# Patient Record
Sex: Female | Born: 1945 | Race: White | Hispanic: No | Smoking: Former smoker
Health system: Southern US, Community
[De-identification: ages and names within clinical notes are randomized; demographics above are authoritative.]

## PROBLEM LIST (undated history)

## (undated) DIAGNOSIS — M1990 Unspecified osteoarthritis, unspecified site: Secondary | ICD-10-CM

## (undated) DIAGNOSIS — D649 Anemia, unspecified: Secondary | ICD-10-CM

## (undated) DIAGNOSIS — J189 Pneumonia, unspecified organism: Secondary | ICD-10-CM

## (undated) DIAGNOSIS — E039 Hypothyroidism, unspecified: Secondary | ICD-10-CM

## (undated) DIAGNOSIS — M797 Fibromyalgia: Secondary | ICD-10-CM

## (undated) DIAGNOSIS — K219 Gastro-esophageal reflux disease without esophagitis: Secondary | ICD-10-CM

## (undated) DIAGNOSIS — M810 Age-related osteoporosis without current pathological fracture: Secondary | ICD-10-CM

## (undated) DIAGNOSIS — I10 Essential (primary) hypertension: Secondary | ICD-10-CM

## (undated) HISTORY — PX: FRACTURE SURGERY: SHX138

## (undated) HISTORY — PX: TONSILLECTOMY: SUR1361

## (undated) HISTORY — PX: BACK SURGERY: SHX140

## (undated) HISTORY — PX: EYE SURGERY: SHX253

---

## 1973-11-19 HISTORY — PX: ABDOMINAL HYSTERECTOMY: SHX81

## 2013-12-14 DIAGNOSIS — S335XXA Sprain of ligaments of lumbar spine, initial encounter: Secondary | ICD-10-CM | POA: Diagnosis not present

## 2013-12-17 DIAGNOSIS — R262 Difficulty in walking, not elsewhere classified: Secondary | ICD-10-CM | POA: Diagnosis not present

## 2013-12-17 DIAGNOSIS — M545 Low back pain, unspecified: Secondary | ICD-10-CM | POA: Diagnosis not present

## 2013-12-17 DIAGNOSIS — S335XXA Sprain of ligaments of lumbar spine, initial encounter: Secondary | ICD-10-CM | POA: Diagnosis not present

## 2013-12-24 DIAGNOSIS — R262 Difficulty in walking, not elsewhere classified: Secondary | ICD-10-CM | POA: Diagnosis not present

## 2013-12-24 DIAGNOSIS — M545 Low back pain, unspecified: Secondary | ICD-10-CM | POA: Diagnosis not present

## 2013-12-24 DIAGNOSIS — S335XXA Sprain of ligaments of lumbar spine, initial encounter: Secondary | ICD-10-CM | POA: Diagnosis not present

## 2013-12-25 DIAGNOSIS — M545 Low back pain, unspecified: Secondary | ICD-10-CM | POA: Diagnosis not present

## 2013-12-25 DIAGNOSIS — R262 Difficulty in walking, not elsewhere classified: Secondary | ICD-10-CM | POA: Diagnosis not present

## 2013-12-25 DIAGNOSIS — S335XXA Sprain of ligaments of lumbar spine, initial encounter: Secondary | ICD-10-CM | POA: Diagnosis not present

## 2013-12-28 DIAGNOSIS — M545 Low back pain, unspecified: Secondary | ICD-10-CM | POA: Diagnosis not present

## 2013-12-28 DIAGNOSIS — S335XXA Sprain of ligaments of lumbar spine, initial encounter: Secondary | ICD-10-CM | POA: Diagnosis not present

## 2013-12-28 DIAGNOSIS — R262 Difficulty in walking, not elsewhere classified: Secondary | ICD-10-CM | POA: Diagnosis not present

## 2013-12-31 DIAGNOSIS — M545 Low back pain, unspecified: Secondary | ICD-10-CM | POA: Diagnosis not present

## 2013-12-31 DIAGNOSIS — S335XXA Sprain of ligaments of lumbar spine, initial encounter: Secondary | ICD-10-CM | POA: Diagnosis not present

## 2013-12-31 DIAGNOSIS — R262 Difficulty in walking, not elsewhere classified: Secondary | ICD-10-CM | POA: Diagnosis not present

## 2014-01-01 DIAGNOSIS — R262 Difficulty in walking, not elsewhere classified: Secondary | ICD-10-CM | POA: Diagnosis not present

## 2014-01-01 DIAGNOSIS — M545 Low back pain, unspecified: Secondary | ICD-10-CM | POA: Diagnosis not present

## 2014-01-01 DIAGNOSIS — S335XXA Sprain of ligaments of lumbar spine, initial encounter: Secondary | ICD-10-CM | POA: Diagnosis not present

## 2014-01-04 DIAGNOSIS — M545 Low back pain, unspecified: Secondary | ICD-10-CM | POA: Diagnosis not present

## 2014-01-04 DIAGNOSIS — S335XXA Sprain of ligaments of lumbar spine, initial encounter: Secondary | ICD-10-CM | POA: Diagnosis not present

## 2014-01-04 DIAGNOSIS — R262 Difficulty in walking, not elsewhere classified: Secondary | ICD-10-CM | POA: Diagnosis not present

## 2014-01-15 DIAGNOSIS — M545 Low back pain, unspecified: Secondary | ICD-10-CM | POA: Diagnosis not present

## 2014-01-15 DIAGNOSIS — S335XXA Sprain of ligaments of lumbar spine, initial encounter: Secondary | ICD-10-CM | POA: Diagnosis not present

## 2014-01-15 DIAGNOSIS — R262 Difficulty in walking, not elsewhere classified: Secondary | ICD-10-CM | POA: Diagnosis not present

## 2014-01-21 DIAGNOSIS — M545 Low back pain, unspecified: Secondary | ICD-10-CM | POA: Diagnosis not present

## 2014-01-21 DIAGNOSIS — S335XXA Sprain of ligaments of lumbar spine, initial encounter: Secondary | ICD-10-CM | POA: Diagnosis not present

## 2014-01-21 DIAGNOSIS — R262 Difficulty in walking, not elsewhere classified: Secondary | ICD-10-CM | POA: Diagnosis not present

## 2014-01-22 DIAGNOSIS — R262 Difficulty in walking, not elsewhere classified: Secondary | ICD-10-CM | POA: Diagnosis not present

## 2014-01-22 DIAGNOSIS — M545 Low back pain, unspecified: Secondary | ICD-10-CM | POA: Diagnosis not present

## 2014-01-22 DIAGNOSIS — S335XXA Sprain of ligaments of lumbar spine, initial encounter: Secondary | ICD-10-CM | POA: Diagnosis not present

## 2014-01-25 DIAGNOSIS — S335XXA Sprain of ligaments of lumbar spine, initial encounter: Secondary | ICD-10-CM | POA: Diagnosis not present

## 2014-01-25 DIAGNOSIS — M48061 Spinal stenosis, lumbar region without neurogenic claudication: Secondary | ICD-10-CM | POA: Diagnosis not present

## 2014-01-28 DIAGNOSIS — S335XXA Sprain of ligaments of lumbar spine, initial encounter: Secondary | ICD-10-CM | POA: Diagnosis not present

## 2014-01-28 DIAGNOSIS — R262 Difficulty in walking, not elsewhere classified: Secondary | ICD-10-CM | POA: Diagnosis not present

## 2014-01-28 DIAGNOSIS — M545 Low back pain, unspecified: Secondary | ICD-10-CM | POA: Diagnosis not present

## 2014-01-29 DIAGNOSIS — M545 Low back pain, unspecified: Secondary | ICD-10-CM | POA: Diagnosis not present

## 2014-01-29 DIAGNOSIS — R262 Difficulty in walking, not elsewhere classified: Secondary | ICD-10-CM | POA: Diagnosis not present

## 2014-01-29 DIAGNOSIS — S335XXA Sprain of ligaments of lumbar spine, initial encounter: Secondary | ICD-10-CM | POA: Diagnosis not present

## 2014-02-02 DIAGNOSIS — S335XXA Sprain of ligaments of lumbar spine, initial encounter: Secondary | ICD-10-CM | POA: Diagnosis not present

## 2014-02-02 DIAGNOSIS — M545 Low back pain, unspecified: Secondary | ICD-10-CM | POA: Diagnosis not present

## 2014-02-02 DIAGNOSIS — R262 Difficulty in walking, not elsewhere classified: Secondary | ICD-10-CM | POA: Diagnosis not present

## 2014-05-10 DIAGNOSIS — I10 Essential (primary) hypertension: Secondary | ICD-10-CM | POA: Diagnosis not present

## 2014-05-10 DIAGNOSIS — E039 Hypothyroidism, unspecified: Secondary | ICD-10-CM | POA: Diagnosis not present

## 2014-05-10 DIAGNOSIS — E785 Hyperlipidemia, unspecified: Secondary | ICD-10-CM | POA: Diagnosis not present

## 2014-05-10 DIAGNOSIS — K219 Gastro-esophageal reflux disease without esophagitis: Secondary | ICD-10-CM | POA: Diagnosis not present

## 2014-05-17 DIAGNOSIS — N200 Calculus of kidney: Secondary | ICD-10-CM | POA: Diagnosis not present

## 2014-05-17 DIAGNOSIS — R3915 Urgency of urination: Secondary | ICD-10-CM | POA: Diagnosis not present

## 2014-05-17 DIAGNOSIS — R109 Unspecified abdominal pain: Secondary | ICD-10-CM | POA: Diagnosis not present

## 2014-05-31 DIAGNOSIS — R109 Unspecified abdominal pain: Secondary | ICD-10-CM | POA: Diagnosis not present

## 2014-05-31 DIAGNOSIS — R3915 Urgency of urination: Secondary | ICD-10-CM | POA: Diagnosis not present

## 2014-05-31 DIAGNOSIS — N200 Calculus of kidney: Secondary | ICD-10-CM | POA: Diagnosis not present

## 2014-06-03 DIAGNOSIS — N200 Calculus of kidney: Secondary | ICD-10-CM | POA: Diagnosis not present

## 2014-06-11 DIAGNOSIS — N200 Calculus of kidney: Secondary | ICD-10-CM | POA: Diagnosis not present

## 2014-06-11 DIAGNOSIS — IMO0002 Reserved for concepts with insufficient information to code with codable children: Secondary | ICD-10-CM | POA: Diagnosis not present

## 2014-06-11 DIAGNOSIS — R3915 Urgency of urination: Secondary | ICD-10-CM | POA: Diagnosis not present

## 2014-06-11 DIAGNOSIS — R109 Unspecified abdominal pain: Secondary | ICD-10-CM | POA: Diagnosis not present

## 2014-06-16 DIAGNOSIS — R109 Unspecified abdominal pain: Secondary | ICD-10-CM | POA: Diagnosis not present

## 2014-06-16 DIAGNOSIS — N200 Calculus of kidney: Secondary | ICD-10-CM | POA: Diagnosis not present

## 2014-06-16 DIAGNOSIS — I10 Essential (primary) hypertension: Secondary | ICD-10-CM | POA: Diagnosis not present

## 2014-06-16 DIAGNOSIS — E0789 Other specified disorders of thyroid: Secondary | ICD-10-CM | POA: Diagnosis not present

## 2014-06-16 DIAGNOSIS — Z79899 Other long term (current) drug therapy: Secondary | ICD-10-CM | POA: Diagnosis not present

## 2014-06-16 DIAGNOSIS — IMO0002 Reserved for concepts with insufficient information to code with codable children: Secondary | ICD-10-CM | POA: Diagnosis not present

## 2014-06-16 DIAGNOSIS — Z888 Allergy status to other drugs, medicaments and biological substances status: Secondary | ICD-10-CM | POA: Diagnosis not present

## 2014-06-16 DIAGNOSIS — Z87442 Personal history of urinary calculi: Secondary | ICD-10-CM | POA: Diagnosis not present

## 2014-06-16 DIAGNOSIS — Z880 Allergy status to penicillin: Secondary | ICD-10-CM | POA: Diagnosis not present

## 2014-06-16 DIAGNOSIS — Z96649 Presence of unspecified artificial hip joint: Secondary | ICD-10-CM | POA: Diagnosis not present

## 2014-06-16 DIAGNOSIS — R3915 Urgency of urination: Secondary | ICD-10-CM | POA: Diagnosis not present

## 2014-06-21 DIAGNOSIS — T148XXA Other injury of unspecified body region, initial encounter: Secondary | ICD-10-CM | POA: Diagnosis not present

## 2014-06-28 DIAGNOSIS — K219 Gastro-esophageal reflux disease without esophagitis: Secondary | ICD-10-CM | POA: Diagnosis not present

## 2014-06-28 DIAGNOSIS — M810 Age-related osteoporosis without current pathological fracture: Secondary | ICD-10-CM | POA: Diagnosis not present

## 2014-06-28 DIAGNOSIS — I10 Essential (primary) hypertension: Secondary | ICD-10-CM | POA: Diagnosis not present

## 2014-06-28 DIAGNOSIS — E039 Hypothyroidism, unspecified: Secondary | ICD-10-CM | POA: Diagnosis not present

## 2014-07-13 DIAGNOSIS — N200 Calculus of kidney: Secondary | ICD-10-CM | POA: Diagnosis not present

## 2014-07-14 DIAGNOSIS — N200 Calculus of kidney: Secondary | ICD-10-CM | POA: Diagnosis not present

## 2014-08-11 DIAGNOSIS — N209 Urinary calculus, unspecified: Secondary | ICD-10-CM | POA: Diagnosis not present

## 2014-08-26 DIAGNOSIS — E785 Hyperlipidemia, unspecified: Secondary | ICD-10-CM | POA: Diagnosis not present

## 2014-08-26 DIAGNOSIS — I10 Essential (primary) hypertension: Secondary | ICD-10-CM | POA: Diagnosis not present

## 2014-08-26 DIAGNOSIS — K219 Gastro-esophageal reflux disease without esophagitis: Secondary | ICD-10-CM | POA: Diagnosis not present

## 2014-08-26 DIAGNOSIS — E039 Hypothyroidism, unspecified: Secondary | ICD-10-CM | POA: Diagnosis not present

## 2014-08-30 DIAGNOSIS — Z1231 Encounter for screening mammogram for malignant neoplasm of breast: Secondary | ICD-10-CM | POA: Diagnosis not present

## 2014-09-23 DIAGNOSIS — N3281 Overactive bladder: Secondary | ICD-10-CM | POA: Diagnosis not present

## 2014-09-23 DIAGNOSIS — N200 Calculus of kidney: Secondary | ICD-10-CM | POA: Diagnosis not present

## 2015-01-06 DIAGNOSIS — R109 Unspecified abdominal pain: Secondary | ICD-10-CM | POA: Diagnosis not present

## 2015-01-06 DIAGNOSIS — E039 Hypothyroidism, unspecified: Secondary | ICD-10-CM | POA: Diagnosis not present

## 2015-01-24 DIAGNOSIS — Z96698 Presence of other orthopedic joint implants: Secondary | ICD-10-CM | POA: Diagnosis not present

## 2015-01-24 DIAGNOSIS — N3281 Overactive bladder: Secondary | ICD-10-CM | POA: Diagnosis not present

## 2015-01-24 DIAGNOSIS — R109 Unspecified abdominal pain: Secondary | ICD-10-CM | POA: Diagnosis not present

## 2015-01-24 DIAGNOSIS — Z96642 Presence of left artificial hip joint: Secondary | ICD-10-CM | POA: Diagnosis not present

## 2015-01-24 DIAGNOSIS — N200 Calculus of kidney: Secondary | ICD-10-CM | POA: Diagnosis not present

## 2015-02-01 DIAGNOSIS — Z23 Encounter for immunization: Secondary | ICD-10-CM | POA: Diagnosis not present

## 2015-02-01 DIAGNOSIS — R109 Unspecified abdominal pain: Secondary | ICD-10-CM | POA: Diagnosis not present

## 2015-04-05 DIAGNOSIS — M25562 Pain in left knee: Secondary | ICD-10-CM | POA: Diagnosis not present

## 2015-04-05 DIAGNOSIS — M25561 Pain in right knee: Secondary | ICD-10-CM | POA: Diagnosis not present

## 2015-04-05 DIAGNOSIS — E785 Hyperlipidemia, unspecified: Secondary | ICD-10-CM | POA: Diagnosis not present

## 2015-04-05 DIAGNOSIS — E039 Hypothyroidism, unspecified: Secondary | ICD-10-CM | POA: Diagnosis not present

## 2015-04-05 DIAGNOSIS — I10 Essential (primary) hypertension: Secondary | ICD-10-CM | POA: Diagnosis not present

## 2015-05-02 DIAGNOSIS — H00031 Abscess of right upper eyelid: Secondary | ICD-10-CM | POA: Diagnosis not present

## 2015-05-10 DIAGNOSIS — H00031 Abscess of right upper eyelid: Secondary | ICD-10-CM | POA: Diagnosis not present

## 2015-05-16 DIAGNOSIS — M810 Age-related osteoporosis without current pathological fracture: Secondary | ICD-10-CM | POA: Diagnosis not present

## 2015-06-14 DIAGNOSIS — H2513 Age-related nuclear cataract, bilateral: Secondary | ICD-10-CM | POA: Diagnosis not present

## 2015-06-17 DIAGNOSIS — I10 Essential (primary) hypertension: Secondary | ICD-10-CM | POA: Diagnosis not present

## 2015-06-17 DIAGNOSIS — E039 Hypothyroidism, unspecified: Secondary | ICD-10-CM | POA: Diagnosis not present

## 2015-06-17 DIAGNOSIS — K210 Gastro-esophageal reflux disease with esophagitis: Secondary | ICD-10-CM | POA: Diagnosis not present

## 2015-09-20 DIAGNOSIS — I10 Essential (primary) hypertension: Secondary | ICD-10-CM | POA: Diagnosis not present

## 2015-09-20 DIAGNOSIS — Z0000 Encounter for general adult medical examination without abnormal findings: Secondary | ICD-10-CM | POA: Diagnosis not present

## 2015-09-20 DIAGNOSIS — K210 Gastro-esophageal reflux disease with esophagitis: Secondary | ICD-10-CM | POA: Diagnosis not present

## 2015-09-20 DIAGNOSIS — E038 Other specified hypothyroidism: Secondary | ICD-10-CM | POA: Diagnosis not present

## 2015-10-06 DIAGNOSIS — Z1211 Encounter for screening for malignant neoplasm of colon: Secondary | ICD-10-CM | POA: Diagnosis not present

## 2015-10-08 ENCOUNTER — Inpatient Hospital Stay (HOSPITAL_COMMUNITY)
Admission: AD | Admit: 2015-10-08 | Discharge: 2015-10-12 | DRG: 480 | Disposition: A | Discharge status: Skilled Nursing Facility | Payer: Medicare Other | Source: Other Acute Inpatient Hospital | Attending: Internal Medicine | Admitting: Internal Medicine

## 2015-10-08 ENCOUNTER — Inpatient Hospital Stay (HOSPITAL_COMMUNITY): Payer: Medicare Other

## 2015-10-08 ENCOUNTER — Encounter (HOSPITAL_COMMUNITY): Payer: Self-pay | Admitting: *Deleted

## 2015-10-08 DIAGNOSIS — Z87891 Personal history of nicotine dependence: Secondary | ICD-10-CM

## 2015-10-08 DIAGNOSIS — D696 Thrombocytopenia, unspecified: Secondary | ICD-10-CM | POA: Diagnosis present

## 2015-10-08 DIAGNOSIS — S3992XA Unspecified injury of lower back, initial encounter: Secondary | ICD-10-CM | POA: Diagnosis not present

## 2015-10-08 DIAGNOSIS — S72002A Fracture of unspecified part of neck of left femur, initial encounter for closed fracture: Secondary | ICD-10-CM

## 2015-10-08 DIAGNOSIS — K219 Gastro-esophageal reflux disease without esophagitis: Secondary | ICD-10-CM | POA: Diagnosis present

## 2015-10-08 DIAGNOSIS — I10 Essential (primary) hypertension: Secondary | ICD-10-CM | POA: Diagnosis not present

## 2015-10-08 DIAGNOSIS — M1990 Unspecified osteoarthritis, unspecified site: Secondary | ICD-10-CM | POA: Diagnosis not present

## 2015-10-08 DIAGNOSIS — Z79899 Other long term (current) drug therapy: Secondary | ICD-10-CM | POA: Diagnosis not present

## 2015-10-08 DIAGNOSIS — M80852A Other osteoporosis with current pathological fracture, left femur, initial encounter for fracture: Secondary | ICD-10-CM | POA: Diagnosis not present

## 2015-10-08 DIAGNOSIS — M5440 Lumbago with sciatica, unspecified side: Secondary | ICD-10-CM

## 2015-10-08 DIAGNOSIS — Z01818 Encounter for other preprocedural examination: Secondary | ICD-10-CM | POA: Diagnosis not present

## 2015-10-08 DIAGNOSIS — E038 Other specified hypothyroidism: Secondary | ICD-10-CM | POA: Diagnosis not present

## 2015-10-08 DIAGNOSIS — Z981 Arthrodesis status: Secondary | ICD-10-CM | POA: Diagnosis not present

## 2015-10-08 DIAGNOSIS — S72002S Fracture of unspecified part of neck of left femur, sequela: Secondary | ICD-10-CM

## 2015-10-08 DIAGNOSIS — S7292XD Unspecified fracture of left femur, subsequent encounter for closed fracture with routine healing: Secondary | ICD-10-CM | POA: Diagnosis not present

## 2015-10-08 DIAGNOSIS — Z888 Allergy status to other drugs, medicaments and biological substances status: Secondary | ICD-10-CM | POA: Diagnosis not present

## 2015-10-08 DIAGNOSIS — D62 Acute posthemorrhagic anemia: Secondary | ICD-10-CM | POA: Diagnosis not present

## 2015-10-08 DIAGNOSIS — M9701XA Periprosthetic fracture around internal prosthetic right hip joint, initial encounter: Secondary | ICD-10-CM | POA: Diagnosis not present

## 2015-10-08 DIAGNOSIS — K449 Diaphragmatic hernia without obstruction or gangrene: Secondary | ICD-10-CM | POA: Diagnosis not present

## 2015-10-08 DIAGNOSIS — M9702XA Periprosthetic fracture around internal prosthetic left hip joint, initial encounter: Principal | ICD-10-CM | POA: Diagnosis present

## 2015-10-08 DIAGNOSIS — E039 Hypothyroidism, unspecified: Secondary | ICD-10-CM | POA: Diagnosis present

## 2015-10-08 DIAGNOSIS — M810 Age-related osteoporosis without current pathological fracture: Secondary | ICD-10-CM | POA: Diagnosis present

## 2015-10-08 DIAGNOSIS — Z9181 History of falling: Secondary | ICD-10-CM | POA: Diagnosis not present

## 2015-10-08 DIAGNOSIS — Z91018 Allergy to other foods: Secondary | ICD-10-CM

## 2015-10-08 DIAGNOSIS — M545 Low back pain, unspecified: Secondary | ICD-10-CM | POA: Insufficient documentation

## 2015-10-08 DIAGNOSIS — M25552 Pain in left hip: Secondary | ICD-10-CM | POA: Diagnosis not present

## 2015-10-08 DIAGNOSIS — Z8249 Family history of ischemic heart disease and other diseases of the circulatory system: Secondary | ICD-10-CM

## 2015-10-08 DIAGNOSIS — S72142A Displaced intertrochanteric fracture of left femur, initial encounter for closed fracture: Secondary | ICD-10-CM | POA: Diagnosis not present

## 2015-10-08 DIAGNOSIS — R2689 Other abnormalities of gait and mobility: Secondary | ICD-10-CM | POA: Diagnosis not present

## 2015-10-08 DIAGNOSIS — M797 Fibromyalgia: Secondary | ICD-10-CM | POA: Diagnosis present

## 2015-10-08 DIAGNOSIS — S72141A Displaced intertrochanteric fracture of right femur, initial encounter for closed fracture: Secondary | ICD-10-CM | POA: Diagnosis not present

## 2015-10-08 DIAGNOSIS — R05 Cough: Secondary | ICD-10-CM

## 2015-10-08 DIAGNOSIS — D72829 Elevated white blood cell count, unspecified: Secondary | ICD-10-CM | POA: Diagnosis not present

## 2015-10-08 DIAGNOSIS — R059 Cough, unspecified: Secondary | ICD-10-CM

## 2015-10-08 DIAGNOSIS — S72142D Displaced intertrochanteric fracture of left femur, subsequent encounter for closed fracture with routine healing: Secondary | ICD-10-CM | POA: Diagnosis not present

## 2015-10-08 DIAGNOSIS — M5136 Other intervertebral disc degeneration, lumbar region: Secondary | ICD-10-CM | POA: Diagnosis not present

## 2015-10-08 DIAGNOSIS — S72009A Fracture of unspecified part of neck of unspecified femur, initial encounter for closed fracture: Secondary | ICD-10-CM | POA: Diagnosis not present

## 2015-10-08 DIAGNOSIS — Z419 Encounter for procedure for purposes other than remedying health state, unspecified: Secondary | ICD-10-CM

## 2015-10-08 DIAGNOSIS — Z880 Allergy status to penicillin: Secondary | ICD-10-CM

## 2015-10-08 DIAGNOSIS — S72002D Fracture of unspecified part of neck of left femur, subsequent encounter for closed fracture with routine healing: Secondary | ICD-10-CM | POA: Diagnosis not present

## 2015-10-08 DIAGNOSIS — Z471 Aftercare following joint replacement surgery: Secondary | ICD-10-CM | POA: Diagnosis not present

## 2015-10-08 DIAGNOSIS — S7292XA Unspecified fracture of left femur, initial encounter for closed fracture: Secondary | ICD-10-CM | POA: Diagnosis present

## 2015-10-08 DIAGNOSIS — S32010A Wedge compression fracture of first lumbar vertebra, initial encounter for closed fracture: Secondary | ICD-10-CM | POA: Diagnosis not present

## 2015-10-08 DIAGNOSIS — W19XXXA Unspecified fall, initial encounter: Secondary | ICD-10-CM | POA: Diagnosis not present

## 2015-10-08 DIAGNOSIS — W010XXA Fall on same level from slipping, tripping and stumbling without subsequent striking against object, initial encounter: Secondary | ICD-10-CM | POA: Diagnosis not present

## 2015-10-08 DIAGNOSIS — S79912A Unspecified injury of left hip, initial encounter: Secondary | ICD-10-CM | POA: Diagnosis not present

## 2015-10-08 DIAGNOSIS — Z96642 Presence of left artificial hip joint: Secondary | ICD-10-CM | POA: Diagnosis not present

## 2015-10-08 DIAGNOSIS — M6281 Muscle weakness (generalized): Secondary | ICD-10-CM | POA: Diagnosis not present

## 2015-10-08 HISTORY — DX: Essential (primary) hypertension: I10

## 2015-10-08 HISTORY — DX: Anemia, unspecified: D64.9

## 2015-10-08 HISTORY — DX: Hypothyroidism, unspecified: E03.9

## 2015-10-08 HISTORY — DX: Gastro-esophageal reflux disease without esophagitis: K21.9

## 2015-10-08 HISTORY — DX: Unspecified osteoarthritis, unspecified site: M19.90

## 2015-10-08 HISTORY — DX: Fibromyalgia: M79.7

## 2015-10-08 HISTORY — DX: Age-related osteoporosis without current pathological fracture: M81.0

## 2015-10-08 HISTORY — DX: Pneumonia, unspecified organism: J18.9

## 2015-10-08 LAB — CBC
HEMATOCRIT: 39.7 % (ref 36.0–46.0)
Hemoglobin: 13.1 g/dL (ref 12.0–15.0)
MCH: 30.5 pg (ref 26.0–34.0)
MCHC: 33 g/dL (ref 30.0–36.0)
MCV: 92.3 fL (ref 78.0–100.0)
PLATELETS: 151 10*3/uL (ref 150–400)
RBC: 4.3 MIL/uL (ref 3.87–5.11)
RDW: 14.3 % (ref 11.5–15.5)
WBC: 10.8 10*3/uL — AB (ref 4.0–10.5)

## 2015-10-08 LAB — TYPE AND SCREEN
ABO/RH(D): O NEG
Antibody Screen: NEGATIVE

## 2015-10-08 LAB — BASIC METABOLIC PANEL
ANION GAP: 6 (ref 5–15)
BUN: 9 mg/dL (ref 6–20)
CALCIUM: 9.4 mg/dL (ref 8.9–10.3)
CO2: 29 mmol/L (ref 22–32)
Chloride: 106 mmol/L (ref 101–111)
Creatinine, Ser: 0.97 mg/dL (ref 0.44–1.00)
GFR, EST NON AFRICAN AMERICAN: 58 mL/min — AB (ref >60)
Glucose, Bld: 110 mg/dL — ABNORMAL HIGH (ref 65–99)
Potassium: 4.9 mmol/L (ref 3.5–5.1)
Sodium: 141 mmol/L (ref 135–145)

## 2015-10-08 LAB — PROTIME-INR
INR: 1 (ref 0.00–1.49)
PROTHROMBIN TIME: 13.4 s (ref 11.6–15.2)

## 2015-10-08 LAB — MAGNESIUM: MAGNESIUM: 2.1 mg/dL (ref 1.7–2.4)

## 2015-10-08 LAB — ABO/RH: ABO/RH(D): O NEG

## 2015-10-08 MED ORDER — ALBUTEROL SULFATE (2.5 MG/3ML) 0.083% IN NEBU: 2.5000 mg | INHALATION_SOLUTION | RESPIRATORY_TRACT | Status: DC | PRN

## 2015-10-08 MED ORDER — POLYETHYLENE GLYCOL 3350 17 G PO PACK: 17.0000 g | PACK | Freq: Every day | ORAL | Status: DC | PRN

## 2015-10-08 MED ORDER — HEPARIN SODIUM (PORCINE) 5000 UNIT/ML IJ SOLN
5000.0000 [IU] | Freq: Three times a day (TID) | INTRAMUSCULAR | Status: DC
  Administered 2015-10-08 – 2015-10-12 (×11): 5000 [IU] via SUBCUTANEOUS
  Filled 2015-10-08 (×10): qty 1

## 2015-10-08 MED ORDER — LEVOTHYROXINE SODIUM 50 MCG PO TABS
50.0000 ug | ORAL_TABLET | Freq: Every day | ORAL | Status: DC
  Administered 2015-10-09 – 2015-10-12 (×4): 50 ug via ORAL
  Filled 2015-10-08 (×5): qty 1

## 2015-10-08 MED ORDER — LOSARTAN POTASSIUM 25 MG PO TABS
25.0000 mg | ORAL_TABLET | Freq: Every day | ORAL | Status: DC
  Administered 2015-10-09: 25 mg via ORAL
  Filled 2015-10-08: qty 1

## 2015-10-08 MED ORDER — SODIUM CHLORIDE 0.9 % IV SOLN
INTRAVENOUS | Status: AC
  Administered 2015-10-09 – 2015-10-10 (×2): via INTRAVENOUS

## 2015-10-08 MED ORDER — MORPHINE SULFATE (PF) 2 MG/ML IV SOLN
0.5000 mg | INTRAVENOUS | Status: DC | PRN
  Administered 2015-10-09: 0.5 mg via INTRAVENOUS
  Filled 2015-10-08: qty 1

## 2015-10-08 MED ORDER — HYDROCODONE-ACETAMINOPHEN 5-325 MG PO TABS
1.0000 | ORAL_TABLET | Freq: Four times a day (QID) | ORAL | Status: DC | PRN
  Administered 2015-10-08 – 2015-10-09 (×2): 2 via ORAL
  Filled 2015-10-08 (×2): qty 2

## 2015-10-08 MED ORDER — ONDANSETRON HCL 4 MG/2ML IJ SOLN: 4.0000 mg | Freq: Four times a day (QID) | INTRAMUSCULAR | Status: DC | PRN

## 2015-10-08 MED ORDER — FAMOTIDINE 20 MG PO TABS
10.0000 mg | ORAL_TABLET | Freq: Every day | ORAL | Status: DC
  Administered 2015-10-08 – 2015-10-12 (×5): 10 mg via ORAL
  Filled 2015-10-08 (×4): qty 1

## 2015-10-08 NOTE — Progress Notes (Addendum)
69 yr old with HTN, hypothyroidism, no other significant hx presents with a fall,comes in with ahip fx due to mechanical fall,  accepted by Dr Ophelia CharterYates for surgery prior to requesting [facility] admission ,  Admit to  tele

## 2015-10-08 NOTE — H&P (Signed)
Patient Demographics:    Cheyenne King, is a 69 y.o. female  MRN: [id number]   DOB - [birth year]  Admit Date - 10/08/2015  Outpatient Primary MD for the patient is [name], MD   With History of -  Past Medical History  Diagnosis Date  . Essential hypertension   . Pneumonia     as an infant and in1980  . Hypothyroidism   . GERD (gastroesophageal reflux disease)   . Arthritis   . Fibromyalgia   . Anemia   . Osteoporosis       Past Surgical History  Procedure Laterality Date  . Back surgery      L3,4,5  . Tonsillectomy    . Eye surgery      REMOVAL OF METAL OBJECT IN LEFT EYE  . Fracture surgery      RIGHT FOOT  . Abdominal hysterectomy  1975    POLYPS IN UTERUS-REMOVAL    in for   No chief complaint on file.     HPI:    Cheyenne King  is a 69 y.o. female, street of essential hypertension, hypothyroidism, GERD, fibromyalgia, previous left hip prosthesis due to a mechanical fall, previous L-spine surgery chronic low back pain, who lives at home and is fairly mobile presented to [facility] ER after a mechanical fall she sustained at home where she landed on her left hip, subsequently developed left hip pain which is worse with bearing weight better with rest, nonradiating, no associated symptoms, she also has dull low back pain which is somewhat chronic in nature has noticed that low back pain has been worse over the last few weeks for which she is following with her back surgeon.  She presented to [facility] ER with left hip pain, left hip x-ray done showed a periprosthetic left hip fracture. Her case was discussed by [facility] ER physician with orthopedic surgeon Dr. [name] who accepted the physician, hospitalist service was called to admit the patient. Of note no blood work or chest x-ray/EKG was  obtained previously. Patient currently symptom free except for dull low back pain and left hip pain as above. He is fairly active can climb 2-3 flights of stairs without any problems. No CAD history or history suggestive of CHF or coronary artery disease.    Review of systems:    In addition to the HPI above,   No Fever-chills, No Headache, No changes with Vision or hearing, No problems swallowing food or Liquids, No Chest pain, Cough or Shortness of Breath, No Abdominal pain, No Nausea or Vommitting, Bowel movements are regular, No Blood in stool or Urine, No dysuria, No new skin rashes or bruises, No new joints pains-aches, except left hip and low back pain as above No new weakness, tingling, numbness in any extremity, No recent weight gain or loss, No polyuria, polydypsia or polyphagia, No significant Mental Stressors.  A full 10 point Review of Systems was  done, except as stated above, all other Review of Systems were negative.    Social History:     Social History  Substance Use Topics  . Smoking status: Former Smoker -- 1.50 packs/day for 50 years    Types: Cigarettes    Quit date: 02/05/2003  . Smokeless tobacco: Not on file  . Alcohol Use: No    Lives - at home and is fairly mobile      Family History :     Family History  Problem Relation Age of Onset  . CAD Mother   . Diabetes Mellitus II Mother   . Stroke Mother   . Heart attack Father   . Diabetes Mellitus II Father   . Arthritis/Rheumatoid Sister   . Heart attack Brother        Home Medications:   Prior to Admission medications   Medication Sig Start Date End Date Taking? Authorizing Provider  levothyroxine (SYNTHROID, LEVOTHROID) 50 MCG tablet Take 50 mcg by mouth daily before breakfast.   Yes Historical Provider, MD  losartan (COZAAR) 25 MG tablet Take 25 mg by mouth daily.   Yes Historical Provider, MD  naproxen (NAPROSYN) 500 MG tablet Take 500 mg by mouth 2 (two) times daily with a meal.    Yes Historical Provider, MD  ranitidine (ZANTAC) 150 MG tablet Take 150 mg by mouth daily after supper.   Yes Historical Provider, MD     Allergies:     Allergies  Allergen Reactions  . Gabapentin Other (See Comments)    Hair loss  . Neosporin Original [Bacitracin-Neomycin-Polymyxin] Swelling  . Penicillins Anaphylaxis  . Senokot Wheat Bran [Wheat Bran] Diarrhea     Physical Exam:   Vitals  Blood pressure 145/73, pulse 72, temperature 98.2 F (36.8 C), temperature source Oral, resp. rate 18, SpO2 100 %.   1. General pleasant elderly white female lying in bed in NAD,    2. Normal affect and insight, Not Suicidal or Homicidal, Awake Alert, Oriented X 3.  3. No F.N deficits, ALL C.Nerves Intact, Strength 5/5 all 4 extremities, Sensation intact all 4 extremities, Plantars down going.  4. Ears and Eyes appear Normal, Conjunctivae clear, PERRLA. Moist Oral Mucosa.  5. Supple Neck, No JVD, No cervical lymphadenopathy appriciated, No Carotid Bruits.  6. Symmetrical Chest wall movement, Good air movement bilaterally, CTAB.  7. RRR, No Gallops, Rubs or Murmurs, No Parasternal Heave.  8. Positive Bowel Sounds, Abdomen Soft, No tenderness, No organomegaly appriciated,No rebound -guarding or rigidity.  9.  No Cyanosis, Normal Skin Turgor, No Skin Rash or Bruise. Leg externally rotated,  10. Good muscle tone,  joints appear normal , no effusions, Normal ROM.  11. No Palpable Lymph Nodes in Neck or Axillae      Data Review:    CBC No results for input(s): WBC, HGB, HCT, PLT, MCV, MCH, MCHC, RDW, LYMPHSABS, MONOABS, EOSABS, BASOSABS, BANDABS in the last 168 hours.  Invalid input(s): NEUTRABS, BANDSABD ------------------------------------------------------------------------------------------------------------------  Chemistries  No results for input(s): NA, K, CL, CO2, GLUCOSE, BUN, CREATININE, CALCIUM, MG, AST, ALT, ALKPHOS, BILITOT in the last 168 hours.  Invalid input(s):  GFRCGP ------------------------------------------------------------------------------------------------------------------ CrCl cannot be calculated (Unknown ideal weight.). ------------------------------------------------------------------------------------------------------------------ No results for input(s): TSH, T4TOTAL, T3FREE, THYROIDAB in the last 72 hours.  Invalid input(s): FREET3   Coagulation profile No results for input(s): INR, PROTIME in the last 168 hours. ------------------------------------------------------------------------------------------------------------------- No results for input(s): DDIMER in the last 72 hours. -------------------------------------------------------------------------------------------------------------------  Cardiac Enzymes No results for input(s): CKMB, TROPONINI,  MYOGLOBIN in the last 168 hours.  Invalid input(s): CK ------------------------------------------------------------------------------------------------------------------ Invalid input(s): POCBNP   ---------------------------------------------------------------------------------------------------------------  Urinalysis No results found for: COLORURINE, APPEARANCEUR, LABSPEC, PHURINE, GLUCOSEU, HGBUR, BILIRUBINUR, KETONESUR, PROTEINUR, UROBILINOGEN, NITRITE, LEUKOCYTESUR  ----------------------------------------------------------------------------------------------------------------   Imaging Results:    No results found.      Assessment & Plan:     1. Mechanical fall with left periprosthetic hip fracture. Case has been discussed with Dr. Jennefer Bravo me, nothing by mouth after midnight surgery likely tomorrow. I will obtain preop basic labs, EKG, chest x-ray, will obtain x-ray of L-spine as she is him planing of some subacute to chronic low back pain which actually precedes the fall, left hip x-ray for our records.  Patient and her sister were clearly told that she will be  low to moderate risk for adverse cardiopulmonary outcome during the perioperative period, they understand the risks and want to proceed with surgery.   2. Essential hypertension. On ARB which will be continued. Monitor renal function.   3. Hypothyroid is him continue home dose Synthroid.   4. GERD. On Zantac continue.     DVT Prophylaxis Heparin    AM Labs Ordered, also please review Full Orders  Family Communication: Admission, patients condition and plan of care including tests being ordered have been discussed with the patient and sister who indicate understanding and agree with the plan and Code Status.  Code Status Full  Likely DC to SNF  Condition fair  Time spent in minutes : *35    Susa Raring K M.D on 10/08/2015 at 5:53 PM  Between 7am to 7pm - Pager - [phone]  After 7pm go to [email or web address] - password [facility]  [facility] - Office  [phone]

## 2015-10-09 ENCOUNTER — Inpatient Hospital Stay (HOSPITAL_COMMUNITY): Payer: Medicare Other

## 2015-10-09 ENCOUNTER — Encounter (HOSPITAL_COMMUNITY)
Admission: AD | Disposition: A | Discharge status: Skilled Nursing Facility | Payer: Self-pay | Source: Other Acute Inpatient Hospital | Attending: Internal Medicine

## 2015-10-09 ENCOUNTER — Inpatient Hospital Stay (HOSPITAL_COMMUNITY): Payer: Medicare Other | Admitting: Certified Registered Nurse Anesthetist

## 2015-10-09 ENCOUNTER — Encounter (HOSPITAL_COMMUNITY): Payer: Self-pay | Admitting: Certified Registered Nurse Anesthetist

## 2015-10-09 DIAGNOSIS — E039 Hypothyroidism, unspecified: Secondary | ICD-10-CM

## 2015-10-09 DIAGNOSIS — S72142A Displaced intertrochanteric fracture of left femur, initial encounter for closed fracture: Secondary | ICD-10-CM

## 2015-10-09 DIAGNOSIS — S72002D Fracture of unspecified part of neck of left femur, subsequent encounter for closed fracture with routine healing: Secondary | ICD-10-CM

## 2015-10-09 HISTORY — PX: HIP ARTHROPLASTY: SHX981

## 2015-10-09 SURGERY — HEMIARTHROPLASTY, HIP, DIRECT ANTERIOR APPROACH, FOR FRACTURE
Anesthesia: General | Site: Hip | Laterality: Left

## 2015-10-09 MED ORDER — BUPIVACAINE HCL (PF) 0.25 % IJ SOLN
INTRAMUSCULAR | Status: AC
  Filled 2015-10-09: qty 30

## 2015-10-09 MED ORDER — ACETAMINOPHEN 650 MG RE SUPP: 650.0000 mg | Freq: Four times a day (QID) | RECTAL | Status: DC | PRN

## 2015-10-09 MED ORDER — FENTANYL CITRATE (PF) 100 MCG/2ML IJ SOLN
INTRAMUSCULAR | Status: DC | PRN
  Administered 2015-10-09 (×2): 50 ug via INTRAVENOUS
  Administered 2015-10-09: 100 ug via INTRAVENOUS

## 2015-10-09 MED ORDER — LACTATED RINGERS IV SOLN
INTRAVENOUS | Status: DC | PRN
  Administered 2015-10-09 (×2): via INTRAVENOUS

## 2015-10-09 MED ORDER — PROPOFOL 10 MG/ML IV BOLUS
INTRAVENOUS | Status: DC | PRN
  Administered 2015-10-09: 150 mg via INTRAVENOUS

## 2015-10-09 MED ORDER — EPHEDRINE SULFATE 50 MG/ML IJ SOLN
INTRAMUSCULAR | Status: DC | PRN
  Administered 2015-10-09 (×2): 10 mg via INTRAVENOUS

## 2015-10-09 MED ORDER — FENTANYL CITRATE (PF) 250 MCG/5ML IJ SOLN
INTRAMUSCULAR | Status: AC
  Filled 2015-10-09: qty 5

## 2015-10-09 MED ORDER — MIDAZOLAM HCL 2 MG/2ML IJ SOLN
INTRAMUSCULAR | Status: AC
  Filled 2015-10-09: qty 2

## 2015-10-09 MED ORDER — NEOSTIGMINE METHYLSULFATE 10 MG/10ML IV SOLN
INTRAVENOUS | Status: AC
  Filled 2015-10-09: qty 6

## 2015-10-09 MED ORDER — ACETAMINOPHEN 325 MG PO TABS: 650.0000 mg | ORAL_TABLET | Freq: Four times a day (QID) | ORAL | Status: DC | PRN

## 2015-10-09 MED ORDER — HYDROMORPHONE HCL 1 MG/ML IJ SOLN: 0.2500 mg | INTRAMUSCULAR | Status: DC | PRN

## 2015-10-09 MED ORDER — GLYCOPYRROLATE 0.2 MG/ML IJ SOLN
INTRAMUSCULAR | Status: AC
  Filled 2015-10-09: qty 1

## 2015-10-09 MED ORDER — SODIUM CHLORIDE 0.9 % IR SOLN
Status: DC | PRN
Start: 2015-10-09 — End: 2015-10-09
  Administered 2015-10-09: 1000 mL

## 2015-10-09 MED ORDER — CLINDAMYCIN PHOSPHATE 600 MG/50ML IV SOLN
600.0000 mg | Freq: Once | INTRAVENOUS | Status: AC
  Administered 2015-10-09: 600 mg via INTRAVENOUS
  Filled 2015-10-09: qty 50

## 2015-10-09 MED ORDER — SUCCINYLCHOLINE CHLORIDE 20 MG/ML IJ SOLN
INTRAMUSCULAR | Status: AC
  Filled 2015-10-09: qty 1

## 2015-10-09 MED ORDER — PHENOL 1.4 % MT LIQD: 1.0000 | OROMUCOSAL | Status: DC | PRN

## 2015-10-09 MED ORDER — LIDOCAINE HCL (CARDIAC) 20 MG/ML IV SOLN
INTRAVENOUS | Status: DC | PRN
  Administered 2015-10-09: 80 mg via INTRAVENOUS

## 2015-10-09 MED ORDER — STERILE WATER FOR INJECTION IJ SOLN
INTRAMUSCULAR | Status: AC
  Filled 2015-10-09: qty 10

## 2015-10-09 MED ORDER — ROCURONIUM BROMIDE 50 MG/5ML IV SOLN
INTRAVENOUS | Status: AC
  Filled 2015-10-09: qty 1

## 2015-10-09 MED ORDER — ASPIRIN EC 325 MG PO TBEC
325.0000 mg | DELAYED_RELEASE_TABLET | Freq: Every day | ORAL | Status: DC
Start: 2015-10-10 — End: 2015-10-12
  Administered 2015-10-10 – 2015-10-12 (×3): 325 mg via ORAL
  Filled 2015-10-09 (×3): qty 1

## 2015-10-09 MED ORDER — BISACODYL 10 MG RE SUPP
10.0000 mg | Freq: Every day | RECTAL | Status: DC | PRN
Start: 2015-10-09 — End: 2015-10-12

## 2015-10-09 MED ORDER — MENTHOL 3 MG MT LOZG
1.0000 | LOZENGE | OROMUCOSAL | Status: DC | PRN
  Filled 2015-10-09: qty 9

## 2015-10-09 MED ORDER — DEXAMETHASONE SODIUM PHOSPHATE 10 MG/ML IJ SOLN
INTRAMUSCULAR | Status: AC
  Filled 2015-10-09: qty 1

## 2015-10-09 MED ORDER — ONDANSETRON HCL 4 MG/2ML IJ SOLN: 4.0000 mg | Freq: Four times a day (QID) | INTRAMUSCULAR | Status: DC | PRN

## 2015-10-09 MED ORDER — HYDROCODONE-ACETAMINOPHEN 5-325 MG PO TABS
1.0000 | ORAL_TABLET | Freq: Four times a day (QID) | ORAL | Status: DC | PRN
  Administered 2015-10-09 – 2015-10-10 (×2): 2 via ORAL
  Filled 2015-10-09 (×2): qty 2

## 2015-10-09 MED ORDER — ONDANSETRON HCL 4 MG/2ML IJ SOLN
INTRAMUSCULAR | Status: AC
  Filled 2015-10-09: qty 2

## 2015-10-09 MED ORDER — GLYCOPYRROLATE 0.2 MG/ML IJ SOLN
INTRAMUSCULAR | Status: DC | PRN
  Administered 2015-10-09: 0.4 mg via INTRAVENOUS

## 2015-10-09 MED ORDER — OXYCODONE HCL 5 MG PO TABS
5.0000 mg | ORAL_TABLET | ORAL | Status: DC | PRN
  Administered 2015-10-09: 10 mg via ORAL
  Filled 2015-10-09: qty 2

## 2015-10-09 MED ORDER — OXYCODONE HCL 5 MG PO TABS: 5.0000 mg | ORAL_TABLET | Freq: Once | ORAL | Status: DC | PRN

## 2015-10-09 MED ORDER — OXYCODONE HCL 5 MG/5ML PO SOLN: 5.0000 mg | Freq: Once | ORAL | Status: DC | PRN

## 2015-10-09 MED ORDER — ROCURONIUM BROMIDE 100 MG/10ML IV SOLN
INTRAVENOUS | Status: DC | PRN
  Administered 2015-10-09: 50 mg via INTRAVENOUS

## 2015-10-09 MED ORDER — ONDANSETRON HCL 4 MG/2ML IJ SOLN
INTRAMUSCULAR | Status: DC | PRN
Start: 2015-10-09 — End: 2015-10-09
  Administered 2015-10-09: 4 mg via INTRAVENOUS

## 2015-10-09 MED ORDER — NEOSTIGMINE METHYLSULFATE 10 MG/10ML IV SOLN
INTRAVENOUS | Status: DC | PRN
  Administered 2015-10-09: 3 mg via INTRAVENOUS

## 2015-10-09 MED ORDER — MORPHINE SULFATE (PF) 2 MG/ML IV SOLN: 0.5000 mg | INTRAVENOUS | Status: DC | PRN

## 2015-10-09 MED ORDER — POLYETHYLENE GLYCOL 3350 17 G PO PACK: 17.0000 g | PACK | Freq: Every day | ORAL | Status: DC | PRN

## 2015-10-09 MED ORDER — ONDANSETRON HCL 4 MG PO TABS: 4.0000 mg | ORAL_TABLET | Freq: Four times a day (QID) | ORAL | Status: DC | PRN

## 2015-10-09 MED ORDER — PROPOFOL 10 MG/ML IV BOLUS
INTRAVENOUS | Status: AC
  Filled 2015-10-09: qty 40

## 2015-10-09 MED ORDER — EPHEDRINE SULFATE 50 MG/ML IJ SOLN
INTRAMUSCULAR | Status: AC
  Filled 2015-10-09: qty 1

## 2015-10-09 MED ORDER — METOCLOPRAMIDE HCL 5 MG PO TABS: 5.0000 mg | ORAL_TABLET | Freq: Three times a day (TID) | ORAL | Status: DC | PRN

## 2015-10-09 MED ORDER — PHENYLEPHRINE HCL 10 MG/ML IJ SOLN
10.0000 mg | INTRAVENOUS | Status: DC | PRN
  Administered 2015-10-09: 20 ug/min via INTRAVENOUS

## 2015-10-09 MED ORDER — DOCUSATE SODIUM 100 MG PO CAPS
100.0000 mg | ORAL_CAPSULE | Freq: Two times a day (BID) | ORAL | Status: DC
Start: 2015-10-09 — End: 2015-10-12
  Administered 2015-10-09 – 2015-10-12 (×6): 100 mg via ORAL
  Filled 2015-10-09 (×7): qty 1

## 2015-10-09 MED ORDER — PHENYLEPHRINE HCL 10 MG/ML IJ SOLN
INTRAMUSCULAR | Status: DC | PRN
  Administered 2015-10-09: 80 ug via INTRAVENOUS
  Administered 2015-10-09: 40 ug via INTRAVENOUS
  Administered 2015-10-09 (×2): 80 ug via INTRAVENOUS

## 2015-10-09 MED ORDER — MEPERIDINE HCL 25 MG/ML IJ SOLN: 6.2500 mg | INTRAMUSCULAR | Status: DC | PRN

## 2015-10-09 MED ORDER — SODIUM CHLORIDE 0.45 % IV SOLN
INTRAVENOUS | Status: DC
  Administered 2015-10-09: 13:00:00 via INTRAVENOUS

## 2015-10-09 MED ORDER — METOCLOPRAMIDE HCL 5 MG/ML IJ SOLN: 5.0000 mg | Freq: Three times a day (TID) | INTRAMUSCULAR | Status: DC | PRN

## 2015-10-09 SURGICAL SUPPLY — 68 items
BENZOIN TINCTURE PRP APPL 2/3 (GAUZE/BANDAGES/DRESSINGS) ×3 IMPLANT
BLADE SAW SAG 73X25 THK (BLADE) ×2
BLADE SAW SGTL 73X25 THK (BLADE) ×1 IMPLANT
BLADE SURG ROTATE 9660 (MISCELLANEOUS) IMPLANT
BRUSH FEMORAL CANAL (MISCELLANEOUS) IMPLANT
CLOSURE WOUND 1/2 X4 (GAUZE/BANDAGES/DRESSINGS) ×2
COVER BACK TABLE 24X17X13 BIG (DRAPES) IMPLANT
DEVICE TROCH REATTACH 4 CABLE (Orthopedic Implant) ×3 IMPLANT
DRAPE C-ARM 42X72 X-RAY (DRAPES) ×3 IMPLANT
DRAPE C-ARMOR (DRAPES) ×3 IMPLANT
DRAPE IMP U-DRAPE 54X76 (DRAPES) ×3 IMPLANT
DRAPE INCISE IOBAN 66X45 STRL (DRAPES) IMPLANT
DRAPE ORTHO SPLIT 77X108 STRL (DRAPES) ×4
DRAPE SURG ORHT 6 SPLT 77X108 (DRAPES) ×2 IMPLANT
DRAPE U-SHAPE 47X51 STRL (DRAPES) ×3 IMPLANT
DRSG AQUACEL AG ADV 3.5X14 (GAUZE/BANDAGES/DRESSINGS) ×3 IMPLANT
DRSG MEPILEX BORDER 4X8 (GAUZE/BANDAGES/DRESSINGS) ×3 IMPLANT
DRSG PAD ABDOMINAL 8X10 ST (GAUZE/BANDAGES/DRESSINGS) ×6 IMPLANT
DURAPREP 26ML APPLICATOR (WOUND CARE) ×3 IMPLANT
ELECT CAUTERY BLADE 6.4 (BLADE) ×3 IMPLANT
ELECT REM PT RETURN 9FT ADLT (ELECTROSURGICAL) ×3
ELECTRODE REM PT RTRN 9FT ADLT (ELECTROSURGICAL) ×1 IMPLANT
EVACUATOR 1/8 PVC DRAIN (DRAIN) IMPLANT
FACESHIELD WRAPAROUND (MASK) ×6 IMPLANT
GAUZE SPONGE 4X4 12PLY STRL (GAUZE/BANDAGES/DRESSINGS) ×3 IMPLANT
GAUZE XEROFORM 5X9 LF (GAUZE/BANDAGES/DRESSINGS) ×3 IMPLANT
GLOVE BIOGEL PI IND STRL 8 (GLOVE) ×2 IMPLANT
GLOVE BIOGEL PI INDICATOR 8 (GLOVE) ×4
GLOVE ORTHO TXT STRL SZ7.5 (GLOVE) ×6 IMPLANT
GOWN STRL REUS W/ TWL LRG LVL3 (GOWN DISPOSABLE) ×1 IMPLANT
GOWN STRL REUS W/ TWL XL LVL3 (GOWN DISPOSABLE) ×1 IMPLANT
GOWN STRL REUS W/TWL 2XL LVL3 (GOWN DISPOSABLE) ×3 IMPLANT
GOWN STRL REUS W/TWL LRG LVL3 (GOWN DISPOSABLE) ×2
GOWN STRL REUS W/TWL XL LVL3 (GOWN DISPOSABLE) ×2
HANDPIECE INTERPULSE COAX TIP (DISPOSABLE)
IMMOBILIZER KNEE 22 UNIV (SOFTGOODS) IMPLANT
KIT BASIN OR (CUSTOM PROCEDURE TRAY) ×3 IMPLANT
KIT ROOM TURNOVER OR (KITS) ×3 IMPLANT
MANIFOLD NEPTUNE II (INSTRUMENTS) ×3 IMPLANT
NDL SUT 2 .5 CRC MAYO 1.732X (NEEDLE) ×1 IMPLANT
NEEDLE HYPO 25GX1X1/2 BEV (NEEDLE) ×3 IMPLANT
NEEDLE MAYO TAPER (NEEDLE) ×2
NS IRRIG 1000ML POUR BTL (IV SOLUTION) ×3 IMPLANT
PACK TOTAL JOINT (CUSTOM PROCEDURE TRAY) ×3 IMPLANT
PACK UNIVERSAL I (CUSTOM PROCEDURE TRAY) ×3 IMPLANT
PAD ARMBOARD 7.5X6 YLW CONV (MISCELLANEOUS) ×6 IMPLANT
PIN STEINMAN 3/16 (PIN) ×3 IMPLANT
SET HNDPC FAN SPRY TIP SCT (DISPOSABLE) IMPLANT
SPONGE LAP 4X18 X RAY DECT (DISPOSABLE) ×6 IMPLANT
STAPLER VISISTAT 35W (STAPLE) ×3 IMPLANT
STRIP CLOSURE SKIN 1/2X4 (GAUZE/BANDAGES/DRESSINGS) ×4 IMPLANT
SUCTION FRAZIER TIP 10 FR DISP (SUCTIONS) ×3 IMPLANT
SUT ETHIBOND CT1 BRD 2-0 30IN (SUTURE) ×6 IMPLANT
SUT ETHIBOND NAB CT1 #1 30IN (SUTURE) ×9 IMPLANT
SUT TICRON (SUTURE) ×6 IMPLANT
SUT VIC AB 0 CT1 27 (SUTURE) ×2
SUT VIC AB 0 CT1 27XBRD ANBCTR (SUTURE) ×1 IMPLANT
SUT VIC AB 1 CT1 27 (SUTURE) ×6
SUT VIC AB 1 CT1 27XBRD ANBCTR (SUTURE) ×3 IMPLANT
SUT VIC AB 2-0 CT1 27 (SUTURE) ×6
SUT VIC AB 2-0 CT1 TAPERPNT 27 (SUTURE) ×3 IMPLANT
SUT VICRYL 0 TIES 12 18 (SUTURE) ×3 IMPLANT
SYR CONTROL 10ML LL (SYRINGE) ×3 IMPLANT
TOWEL OR 17X24 6PK STRL BLUE (TOWEL DISPOSABLE) ×3 IMPLANT
TOWEL OR 17X26 10 PK STRL BLUE (TOWEL DISPOSABLE) ×3 IMPLANT
TOWER CARTRIDGE SMART MIX (DISPOSABLE) IMPLANT
TRAY FOLEY CATH 16FRSI W/METER (SET/KITS/TRAYS/PACK) IMPLANT
WATER STERILE IRR 1000ML POUR (IV SOLUTION) ×12 IMPLANT

## 2015-10-09 NOTE — Progress Notes (Signed)
[facility] PROGRESS NOTE  Joycelyn SchmidJoann Schwebach ZOX:[id number] DOB: 06/13/1946 DOA: 10/08/2015 PCP: Toma DeitersXAJE A HASANAJ, MD  Assessment/Plan: 1. Mechanical fall with left periprosthetic hip fracture.  -s/p ORIF -well tolerated -will follow ortho rec's; assess with PT/OT capacity and degree of assistance for safe discharge -continue PRN supportive care and analgesics  -will follow Hgb trend after surgery   2. Essential hypertension. Stable and fairly well controlled -will continue losartan  3. Hypothyroid: continue synthroid   4. GERD. Continue Zantac   5.leukocytosis: due to demargination most likely; no signs of infection reported or appreciated   Code Status: Full Family Communication: sister at bedside Disposition Plan: to be determine; PT/OT to evaluate patient capacity and needs; continue supportive care and PRN analgesics   Consultants:  Orthopedic surgery (Dr. Ophelia CharterYates)  Procedures: Open reduction and internal fixation of left intertrochanteric hip fracture. 10/09/15  Antibiotics:  None   HPI/Subjective: Afebrile, no CP, no SOB. Groggy from surgery, but appropriate. Reports pain overall control   Objective: Filed Vitals:   10/09/15 1347 10/09/15 1430  BP: 120/55 114/77  Pulse: 62 74  Temp:    Resp:      Intake/Output Summary (Last 24 hours) at 10/09/15 1558 Last data filed at 10/09/15 1224  Gross per 24 hour  Intake   1400 ml  Output    750 ml  Net    650 ml   Filed Weights   10/08/15 2136  Weight: 58.968 kg (130 lb)    Exam:   General:  Afebrile, denies CP or SOB. Slightly groggy from anesthesia, but overall appropriate and in no distress.    Cardiovascular: RRR, no rubs, no gallops  Respiratory: good air movement, no wheezing, no crackles  Abdomen: soft, NT, ND, positive BS  Musculoskeletal: no cyanosis; pulses appreciated on exam; reports left leg is numb and with mild discomfort   Data Reviewed: Basic Metabolic Panel:  Recent Labs Lab  10/08/15 1819  NA 141  K 4.9  CL 106  CO2 29  GLUCOSE 110*  BUN 9  CREATININE 0.97  CALCIUM 9.4  MG 2.1   CBC:  Recent Labs Lab 10/08/15 1819  WBC 10.8*  HGB 13.1  HCT 39.7  MCV 92.3  PLT 151   Studies: Dg Lumbar Spine 1 View  10/08/2015  CLINICAL DATA:  Fall. Low back pain. Prior lumbar spine surgery in 2014. EXAM: LUMBAR SPINE - 1 VIEW COMPARISON:  09/1915 AP lumbar spine radiograph. FINDINGS: This report assumes 5 non rib-bearing lumbar vertebrae. There is a moderate L1 vertebral body compression fracture of indeterminate chronicity, with approximately 50% anterior loss of vertebral body height. The L2, L3 and L5 vertebral body heights appear preserved. There is mild approximately 30% loss of height of the L4 vertebral body of indeterminate chronicity. There is 5 mm anterolisthesis at L4-5. Pedicle screws are noted overlying the L3, L4 and L5 vertebral levels, with no evidence of hardware fracture or loosening. Atherosclerotic calcifications throughout the abdominal aorta. Partially visualized hip arthroplasty hardware. Mild degenerative disc disease at T12-L1 and L5-S1. No aggressive appearing focal osseous lesions. IMPRESSION: 1. Moderate L1 and mild L4 vertebral body compression fractures of indeterminate chronicity. 2. Mild 5 mm anterolisthesis at L4-5. 3. Pedicle screws at L3 through L5, with no evidence of hardware complication. Electronically Signed   By: Delbert PhenixJason A Poff M.D.   On: 10/08/2015 20:40   Dg Lumbar Spine 1 View  10/08/2015  CLINICAL DATA:  Low back pain. EXAM: LUMBAR SPINE - 1 VIEW COMPARISON:  None. FINDINGS: Single AP view of the lumbar spine is provided. There is methylmethacrylate from prior vertebral body augmentation in the L4, L5 and S1 vertebral bodies with bilateral pedicle screws at each level. There are compression deformities of the L2 and L3 vertebral bodies. There is generalized osteopenia. IMPRESSION: Prior vertebral body augmentation at L4, L5 and S1  with bilateral pedicle screws at each level. Compression deformities of the L2 and L3 vertebral bodies on a single AP view. Recommend further evaluation with a lateral view of the lumbar spine. Electronically Signed   By: Elige Ko   On: 10/08/2015 19:08   Pelvis Portable  10/09/2015  CLINICAL DATA:  Repair of hip fracture EXAM: PORTABLE PELVIS 1-2 VIEWS COMPARISON:  10/08/2015 FINDINGS: Left hip hemiarthroplasty. Superimposed fracture proximal femur has been fixed with a lateral plate and cerclage wires. No dislocation. No other fracture Pedicle screws bilaterally at L3, L4, L5 without connecting rods. IMPRESSION: Left hip hemiarthroplasty. Proximal femur fracture has been fixed with a lateral plate and cerclage wires. Electronically Signed   By: Marlan Palau M.D.   On: 10/09/2015 13:08   Dg Pelvis Portable  10/08/2015  CLINICAL DATA:  Low back pain EXAM: PORTABLE PELVIS 1-2 VIEWS COMPARISON:  None. FINDINGS: There is generalized osteopenia. There is a left hip arthroplasty. There is no acute fracture or dislocation. 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 There are degenerative changes of bilateral SI joints. There is prior augmentation of the L4, L5 and S1 vertebral bodies. IMPRESSION: No acute osseous injury of the pelvis. Electronically Signed   By: Elige Ko   On: 10/08/2015 19:08   Dg Chest Port 1 View  10/08/2015  CLINICAL DATA:  Preoperative evaluation.  Left hip fracture. EXAM: PORTABLE CHEST 1 VIEW COMPARISON:  None. FINDINGS: Patient is rotated to the left, limiting evaluation. Normal cardiac and mediastinal contours given patient rotation. No large area of pulmonary consolidation. No pleural effusion or pneumothorax. IMPRESSION: No acute cardiopulmonary process. Electronically Signed   By: Annia Belt M.D.   On: 10/08/2015 19:07   Dg Hip Operative Unilat With Pelvis Left  10/09/2015  CLINICAL DATA:  Left intertrochanteric periprosthetic hip arthroplasty. EXAM: OPERATIVE  LEFT HIP (WITH PELVIS IF PERFORMED)  VIEWS TECHNIQUE: Fluoroscopic spot image(s) were submitted for interpretation post-operatively. COMPARISON:  Radiograph dated 10/08/2015. FINDINGS: Intraoperative fluoroscopic images demonstrate placement of side plate and cerclage wire fixation around left proximal femur pre-existing long stem component of total hip arthroplasty. The alignment is anatomic. Fluoroscopy time is recorded as 12.6 seconds. IMPRESSION: Intraoperative images from left intertrochanteric periprostatic hip arthroplasty. No evidence of immediate complications. Electronically Signed   By: Ted Mcalpine M.D.   On: 10/09/2015 11:03    Scheduled Meds: . [START ON 10/10/2015] aspirin EC  325 mg Oral Q breakfast  . docusate sodium  100 mg Oral BID  . famotidine  10 mg Oral Daily  . heparin  5,000 Units Subcutaneous 3 times per day  . levothyroxine  50 mcg Oral QAC breakfast  . losartan  25 mg Oral Daily   Continuous Infusions: . sodium chloride 75 mL/hr at 10/09/15 1305  . sodium chloride 75 mL/hr at 10/09/15 4098    Principal Problem:   Femur fracture, left (HCC) Active Problems:   GERD (gastroesophageal reflux disease)   Hypothyroidism   Osteoporosis   Fibromyalgia   Essential hypertension   Hip fracture (HCC)   Low back pain   Intertrochanteric fracture of left hip (HCC)    Time  spent: 30 minutes    Vassie Loll  [facility] Pager [phone]. If 7PM-7AM, please contact night-coverage at [email or web address], password [facility] 10/09/2015, 3:58 PM  LOS: 1 day

## 2015-10-09 NOTE — Anesthesia Preprocedure Evaluation (Addendum)
Anesthesia Evaluation  Patient identified by MRN, date of birth, ID band Patient awake  General Assessment Comment:  Cheyenne King is a pleasant 69 y.o. female, with a hx of HTN, hypothyroidism, GERD, fibromyalgia, previous left hip prosthesis due to a mechanical fall, previous L-spine surgery chronic low back pain, who had a fall at home landing on her left hip. Pt presents today for a LEFT INTERTROCHANTERIC PERI-PROSTETIC HIP ARTROPLASTY by Dr Annell GreeningMark Yates.  Reviewed: Allergy & Precautions, NPO status , Patient's Chart, lab work & pertinent test results  History of Anesthesia Complications (+) PROLONGED EMERGENCE  Airway Mallampati: I  TM Distance: >3 FB Neck ROM: Full    Dental  (+) Teeth Intact, Dental Advisory Given   Pulmonary pneumonia, resolved, former smoker,    breath sounds clear to auscultation       Cardiovascular hypertension, Pt. on medications  Rhythm:Regular Rate:Normal     Neuro/Psych  Neuromuscular disease    GI/Hepatic GERD  Medicated and Controlled,  Endo/Other  Hypothyroidism   Renal/GU      Musculoskeletal  (+) Arthritis , Osteoarthritis,  Fibromyalgia -, narcotic dependent  Abdominal   Peds  Hematology  (+) anemia ,   Anesthesia Other Findings   Reproductive/Obstetrics                         Anesthesia Physical Anesthesia Plan  ASA: II  Anesthesia Plan: General   Post-op Pain Management:    Induction: Intravenous  Airway Management Planned: Oral ETT  Additional Equipment:   Intra-op Plan:   Post-operative Plan: Extubation in OR  Informed Consent: I have reviewed the patients History and Physical, chart, labs and discussed the procedure including the risks, benefits and alternatives for the proposed anesthesia with the patient or authorized representative who has indicated his/her understanding and acceptance.   Dental advisory given  Plan Discussed  with: CRNA, Anesthesiologist and Surgeon  Anesthesia Plan Comments:         Anesthesia Quick Evaluation

## 2015-10-09 NOTE — Anesthesia Postprocedure Evaluation (Signed)
  Anesthesia Post-op Note  Patient: Cheyenne King  Procedure(s) Performed: Procedure(s): LEFT INTERTROCHANTERIC  ORIF   (Left)  Patient Location: PACU  Anesthesia Type: General   Level of Consciousness: awake, alert  and oriented  Airway and Oxygen Therapy: Patient Spontanous Breathing  Post-op Pain: none  Post-op Assessment: Post-op Vital signs reviewed  Post-op Vital Signs: Reviewed  Last Vitals:  Filed Vitals:   10/09/15 1102  BP: 126/67  Pulse: 73  Temp:   Resp: 19    Complications: No apparent anesthesia complications

## 2015-10-09 NOTE — Interval H&P Note (Signed)
History and Physical Interval Note:  10/09/2015 7:59 AM  Cheyenne King  has presented today for surgery, with the diagnosis of left intertrocheric peri-prosthetic hip fracture  The various methods of treatment have been discussed with the patient and family. After consideration of risks, benefits and other options for treatment, the patient has consented to  Procedure(s): LEFT INTERTROCHANTERIC PERI-PROSTETIC HIP ARTROPLASTY (Left) as a surgical intervention .  The patient's history has been reviewed, patient examined, no change in status, stable for surgery.  I have reviewed the patient's chart and labs.  Questions were answered to the patient's satisfaction.     Ramiah Helfrich C

## 2015-10-09 NOTE — Consult Note (Signed)
Reason for Consult:fall with left Periprothetic femur fx Referring Physician: Candiss Norse MD.    Pt originally from [facility] lives in [place] here with her Sister.   Cheyenne King is an 69 y.o. female.  HPI: fell with increased pain left hip with motion. Presented to ER in [facility] where they have no ER coverage. Pt active and community ambulator. Has some chronic LBP with previous fusion then later rods removed.  Has cemented pedicle screws.    Past Medical History  Diagnosis Date  . Essential hypertension   . Pneumonia     as an infant and in1980  . Hypothyroidism   . GERD (gastroesophageal reflux disease)   . Arthritis   . Fibromyalgia   . Anemia   . Osteoporosis     Past Surgical History  Procedure Laterality Date  . Back surgery      L3,4,5  . Tonsillectomy    . Eye surgery      REMOVAL OF METAL OBJECT IN LEFT EYE  . Fracture surgery      RIGHT FOOT  . Abdominal hysterectomy  1975    POLYPS IN UTERUS-REMOVAL    Family History  Problem Relation Age of Onset  . CAD Mother   . Diabetes Mellitus II Mother   . Stroke Mother   . Heart attack Father   . Diabetes Mellitus II Father   . Arthritis/Rheumatoid Sister   . Heart attack Brother     Social History:  reports that she quit smoking about 12 years ago. Her smoking use included Cigarettes. She has a 75 pack-year smoking history. She does not have any smokeless tobacco history on file. She reports that she does not drink alcohol or use illicit drugs.  Allergies:  Allergies  Allergen Reactions  . Gabapentin Other (See Comments)    Hair loss  . Neosporin Original [Bacitracin-Neomycin-Polymyxin] Swelling  . Penicillins Anaphylaxis  . Senokot Wheat Bran [Wheat Bran] Diarrhea    Medications: I have reviewed the patient's current medications.  Results for orders placed or performed during the hospital encounter of 10/08/15 (from the past 48 hour(s))  CBC     Status: Abnormal   Collection Time:  10/08/15  6:19 PM  Result Value Ref Range   WBC 10.8 (H) 4.0 - 10.5 K/uL   RBC 4.30 3.87 - 5.11 MIL/uL   Hemoglobin 13.1 12.0 - 15.0 g/dL   HCT 39.7 36.0 - 46.0 %   MCV 92.3 78.0 - 100.0 fL   MCH 30.5 26.0 - 34.0 pg   MCHC 33.0 30.0 - 36.0 g/dL   RDW 14.3 11.5 - 15.5 %   Platelets 151 150 - 400 K/uL  Basic metabolic panel     Status: Abnormal   Collection Time: 10/08/15  6:19 PM  Result Value Ref Range   Sodium 141 135 - 145 mmol/L   Potassium 4.9 3.5 - 5.1 mmol/L   Chloride 106 101 - 111 mmol/L   CO2 29 22 - 32 mmol/L   Glucose, Bld 110 (H) 65 - 99 mg/dL   BUN 9 6 - 20 mg/dL   Creatinine, Ser 0.97 0.44 - 1.00 mg/dL   Calcium 9.4 8.9 - 10.3 mg/dL   GFR calc non Af Amer 58 (L) >60 mL/min   GFR calc Af Amer >60 >60 mL/min    Comment: (NOTE) The eGFR has been calculated using the CKD EPI equation. This calculation has not been validated in all clinical situations. eGFR's persistently <60 mL/min signify possible  Chronic Kidney Disease.    Anion gap 6 5 - 15  Protime-INR     Status: None   Collection Time: 10/08/15  6:19 PM  Result Value Ref Range   Prothrombin Time 13.4 11.6 - 15.2 seconds   INR 1.00 0.00 - 1.49  Magnesium     Status: None   Collection Time: 10/08/15  6:19 PM  Result Value Ref Range   Magnesium 2.1 1.7 - 2.4 mg/dL  Type and screen [place]     Status: None   Collection Time: 10/08/15  6:19 PM  Result Value Ref Range   ABO/RH(D) O NEG    Antibody Screen NEG    Sample Expiration 10/11/2015   ABO/Rh     Status: None   Collection Time: 10/08/15  6:19 PM  Result Value Ref Range   ABO/RH(D) O NEG     Dg Lumbar Spine 1 View  10/08/2015  CLINICAL DATA:  Fall. Low back pain. Prior lumbar spine surgery in 2014. EXAM: LUMBAR SPINE - 1 VIEW COMPARISON:  09/1915 AP lumbar spine radiograph. FINDINGS: This report assumes 5 non rib-bearing lumbar vertebrae. There is a moderate L1 vertebral body compression fracture of indeterminate chronicity,  with approximately 50% anterior loss of vertebral body height. The L2, L3 and L5 vertebral body heights appear preserved. There is mild approximately 30% loss of height of the L4 vertebral body of indeterminate chronicity. There is 5 mm anterolisthesis at L4-5. Pedicle screws are noted overlying the L3, L4 and L5 vertebral levels, with no evidence of hardware fracture or loosening. Atherosclerotic calcifications throughout the abdominal aorta. Partially visualized hip arthroplasty hardware. Mild degenerative disc disease at T12-L1 and L5-S1. No aggressive appearing focal osseous lesions. IMPRESSION: 1. Moderate L1 and mild L4 vertebral body compression fractures of indeterminate chronicity. 2. Mild 5 mm anterolisthesis at L4-5. 3. Pedicle screws at L3 through L5, with no evidence of hardware complication. Electronically Signed   By: Ilona Sorrel M.D.   On: 10/08/2015 20:40   Dg Lumbar Spine 1 View  10/08/2015  CLINICAL DATA:  Low back pain. EXAM: LUMBAR SPINE - 1 VIEW COMPARISON:  None. FINDINGS: Single AP view of the lumbar spine is provided. There is methylmethacrylate from prior vertebral body augmentation in the L4, L5 and S1 vertebral bodies with bilateral pedicle screws at each level. There are compression deformities of the L2 and L3 vertebral bodies. There is generalized osteopenia. IMPRESSION: Prior vertebral body augmentation at L4, L5 and S1 with bilateral pedicle screws at each level. Compression deformities of the L2 and L3 vertebral bodies on a single AP view. Recommend further evaluation with a lateral view of the lumbar spine. Electronically Signed   By: Kathreen Devoid   On: 10/08/2015 19:08   Dg Pelvis Portable  10/08/2015  CLINICAL DATA:  Low back pain EXAM: PORTABLE PELVIS 1-2 VIEWS COMPARISON:  None. FINDINGS: There is generalized osteopenia. There is a left hip arthroplasty. There is no acute fracture or dislocation. 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 There are degenerative  changes of bilateral SI joints. There is prior augmentation of the L4, L5 and S1 vertebral bodies. IMPRESSION: No acute osseous injury of the pelvis. Electronically Signed   By: Kathreen Devoid   On: 10/08/2015 19:08   Dg Chest Port 1 View  10/08/2015  CLINICAL DATA:  Preoperative evaluation.  Left hip fracture. EXAM: PORTABLE CHEST 1 VIEW COMPARISON:  None. FINDINGS: Patient is rotated to the left, limiting evaluation. Normal cardiac  and mediastinal contours given patient rotation. No large area of pulmonary consolidation. No pleural effusion or pneumothorax. IMPRESSION: No acute cardiopulmonary process. Electronically Signed   By: Lovey Newcomer M.D.   On: 10/08/2015 19:07    Review of Systems  Constitutional: Negative for fever, chills, weight loss and diaphoresis.  HENT: Negative for hearing loss.   Eyes: Negative for photophobia.  Cardiovascular: Negative for chest pain.  Musculoskeletal: Positive for back pain and falls.       Previous left hip hemiarthroplasty for femoral neck fx. Previous lumbar fusion with cemented pedicle screws. Rods have been removed.   Skin: Negative for rash.  Neurological: Negative for dizziness.  Psychiatric/Behavioral: Negative for hallucinations and substance abuse.   Blood pressure 100/50, pulse 72, temperature 98.4 F (36.9 C), temperature source Oral, resp. rate 16, weight 58.968 kg (130 lb), SpO2 100 %. Physical Exam  Constitutional: She is oriented to person, place, and time. She appears well-developed and well-nourished.  HENT:  Head: Normocephalic and atraumatic.  Eyes: EOM are normal. Pupils are equal, round, and reactive to light.  Neck: Normal range of motion. Neck supple. No tracheal deviation present. No thyromegaly present.  Cardiovascular: Normal rate.   GI: Soft. She exhibits no distension.  Musculoskeletal:  Pain with left hip ROM. No shortening . Pulses normal DP and PT  Neurological: She is alert and oriented to person, place, and time. She  has normal reflexes.  Skin: Skin is warm and dry. No erythema.  Psychiatric: She has a normal mood and affect. Her behavior is normal. Judgment and thought content normal.    Assessment/Plan: Left periprothetic IT hip fx.  Plan combination Cable plate fixation device for hip fx stabilization. She has pressfit stem with extensive bone ingrown and to change prothesis would cause extensive destruction to the proximal femur. I discussed recommendations with pt and her sister. She understands and agrees to proceed, risks discussed. Surgery Sunday late AM. NPO  YATES,MARK C 10/09/2015, 1:17 AM

## 2015-10-09 NOTE — Progress Notes (Signed)
Orthopedic Tech Progress Note Patient Details:  Joycelyn SchmidJoann Meche 04/14/1946 [id number]  Patient ID: Joycelyn SchmidJoann Meece, female   DOB: 06/03/1946, 69 y.o.   MRN: [id number]   Saul FordyceJennifer C Jhaden Pizzuto 10/09/2015, 1:04 PMTrapeze bar.

## 2015-10-09 NOTE — Anesthesia Procedure Notes (Signed)
Procedure Name: Intubation Date/Time: 10/09/2015 8:27 AM Performed by: Fabian NovemberSOLHEIM, Cheyenne Anthis SALOMAN Pre-anesthesia Checklist: Patient identified, Patient being monitored, Timeout performed, Emergency Drugs available and Suction available Patient Re-evaluated:Patient Re-evaluated prior to inductionOxygen Delivery Method: Circle System Utilized Preoxygenation: Pre-oxygenation with 100% oxygen Intubation Type: IV induction Ventilation: Mask ventilation without difficulty Laryngoscope Size: Miller and 3 Grade View: Grade I Tube type: Oral Tube size: 7.5 mm Number of attempts: 1 Airway Equipment and Method: Stylet Placement Confirmation: ETT inserted through vocal cords under direct vision,  positive ETCO2 and breath sounds checked- equal and bilateral Secured at: 22 cm Tube secured with: Tape Dental Injury: Teeth and Oropharynx as per pre-operative assessment

## 2015-10-09 NOTE — H&P (View-Only) (Signed)
Reason for Consult:fall with left Periprothetic femur fx Referring Physician: Singh MD.    Pt originally from [facility] lives in [place] here with her Sister.   Cheyenne King is an 69 y.o. female.  HPI: fell with increased pain left hip with motion. Presented to ER in [facility] where they have no ER coverage. Pt active and community ambulator. Has some chronic LBP with previous fusion then later rods removed.  Has cemented pedicle screws.    Past Medical History  Diagnosis Date  . Essential hypertension   . Pneumonia     as an infant and in1980  . Hypothyroidism   . GERD (gastroesophageal reflux disease)   . Arthritis   . Fibromyalgia   . Anemia   . Osteoporosis     Past Surgical History  Procedure Laterality Date  . Back surgery      L3,4,5  . Tonsillectomy    . Eye surgery      REMOVAL OF METAL OBJECT IN LEFT EYE  . Fracture surgery      RIGHT FOOT  . Abdominal hysterectomy  1975    POLYPS IN UTERUS-REMOVAL    Family History  Problem Relation Age of Onset  . CAD Mother   . Diabetes Mellitus II Mother   . Stroke Mother   . Heart attack Father   . Diabetes Mellitus II Father   . Arthritis/Rheumatoid Sister   . Heart attack Brother     Social History:  reports that she quit smoking about 12 years ago. Her smoking use included Cigarettes. She has a 75 pack-year smoking history. She does not have any smokeless tobacco history on file. She reports that she does not drink alcohol or use illicit drugs.  Allergies:  Allergies  Allergen Reactions  . Gabapentin Other (See Comments)    Hair loss  . Neosporin Original [Bacitracin-Neomycin-Polymyxin] Swelling  . Penicillins Anaphylaxis  . Senokot Wheat Bran [Wheat Bran] Diarrhea    Medications: I have reviewed the patient's current medications.  Results for orders placed or performed during the hospital encounter of 10/08/15 (from the past 48 hour(s))  CBC     Status: Abnormal   Collection Time:  10/08/15  6:19 PM  Result Value Ref Range   WBC 10.8 (H) 4.0 - 10.5 K/uL   RBC 4.30 3.87 - 5.11 MIL/uL   Hemoglobin 13.1 12.0 - 15.0 g/dL   HCT 39.7 36.0 - 46.0 %   MCV 92.3 78.0 - 100.0 fL   MCH 30.5 26.0 - 34.0 pg   MCHC 33.0 30.0 - 36.0 g/dL   RDW 14.3 11.5 - 15.5 %   Platelets 151 150 - 400 K/uL  Basic metabolic panel     Status: Abnormal   Collection Time: 10/08/15  6:19 PM  Result Value Ref Range   Sodium 141 135 - 145 mmol/L   Potassium 4.9 3.5 - 5.1 mmol/L   Chloride 106 101 - 111 mmol/L   CO2 29 22 - 32 mmol/L   Glucose, Bld 110 (H) 65 - 99 mg/dL   BUN 9 6 - 20 mg/dL   Creatinine, Ser 0.97 0.44 - 1.00 mg/dL   Calcium 9.4 8.9 - 10.3 mg/dL   GFR calc non Af Amer 58 (L) >60 mL/min   GFR calc Af Amer >60 >60 mL/min    Comment: (NOTE) The eGFR has been calculated using the CKD EPI equation. This calculation has not been validated in all clinical situations. eGFR's persistently <60 mL/min signify possible   Chronic Kidney Disease.    Anion gap 6 5 - 15  Protime-INR     Status: None   Collection Time: 10/08/15  6:19 PM  Result Value Ref Range   Prothrombin Time 13.4 11.6 - 15.2 seconds   INR 1.00 0.00 - 1.49  Magnesium     Status: None   Collection Time: 10/08/15  6:19 PM  Result Value Ref Range   Magnesium 2.1 1.7 - 2.4 mg/dL  Type and screen [facility]     Status: None   Collection Time: 10/08/15  6:19 PM  Result Value Ref Range   ABO/RH(D) O NEG    Antibody Screen NEG    Sample Expiration 10/11/2015   ABO/Rh     Status: None   Collection Time: 10/08/15  6:19 PM  Result Value Ref Range   ABO/RH(D) O NEG     Dg Lumbar Spine 1 View  10/08/2015  CLINICAL DATA:  Fall. Low back pain. Prior lumbar spine surgery in 2014. EXAM: LUMBAR SPINE - 1 VIEW COMPARISON:  09/1915 AP lumbar spine radiograph. FINDINGS: This report assumes 5 non rib-bearing lumbar vertebrae. There is a moderate L1 vertebral body compression fracture of indeterminate chronicity,  with approximately 50% anterior loss of vertebral body height. The L2, L3 and L5 vertebral body heights appear preserved. There is mild approximately 30% loss of height of the L4 vertebral body of indeterminate chronicity. There is 5 mm anterolisthesis at L4-5. Pedicle screws are noted overlying the L3, L4 and L5 vertebral levels, with no evidence of hardware fracture or loosening. Atherosclerotic calcifications throughout the abdominal aorta. Partially visualized hip arthroplasty hardware. Mild degenerative disc disease at T12-L1 and L5-S1. No aggressive appearing focal osseous lesions. IMPRESSION: 1. Moderate L1 and mild L4 vertebral body compression fractures of indeterminate chronicity. 2. Mild 5 mm anterolisthesis at L4-5. 3. Pedicle screws at L3 through L5, with no evidence of hardware complication. Electronically Signed   By: Jason A Poff M.D.   On: 10/08/2015 20:40   Dg Lumbar Spine 1 View  10/08/2015  CLINICAL DATA:  Low back pain. EXAM: LUMBAR SPINE - 1 VIEW COMPARISON:  None. FINDINGS: Single AP view of the lumbar spine is provided. There is methylmethacrylate from prior vertebral body augmentation in the L4, L5 and S1 vertebral bodies with bilateral pedicle screws at each level. There are compression deformities of the L2 and L3 vertebral bodies. There is generalized osteopenia. IMPRESSION: Prior vertebral body augmentation at L4, L5 and S1 with bilateral pedicle screws at each level. Compression deformities of the L2 and L3 vertebral bodies on a single AP view. Recommend further evaluation with a lateral view of the lumbar spine. Electronically Signed   By: Hetal  Patel   On: 10/08/2015 19:08   Dg Pelvis Portable  10/08/2015  CLINICAL DATA:  Low back pain EXAM: PORTABLE PELVIS 1-2 VIEWS COMPARISON:  None. FINDINGS: There is generalized osteopenia. There is a left hip arthroplasty. There is no acute fracture or dislocation. 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 There are degenerative  changes of bilateral SI joints. There is prior augmentation of the L4, L5 and S1 vertebral bodies. IMPRESSION: No acute osseous injury of the pelvis. Electronically Signed   By: Hetal  Patel   On: 10/08/2015 19:08   Dg Chest Port 1 View  10/08/2015  CLINICAL DATA:  Preoperative evaluation.  Left hip fracture. EXAM: PORTABLE CHEST 1 VIEW COMPARISON:  None. FINDINGS: Patient is rotated to the left, limiting evaluation. Normal cardiac   and mediastinal contours given patient rotation. No large area of pulmonary consolidation. No pleural effusion or pneumothorax. IMPRESSION: No acute cardiopulmonary process. Electronically Signed   By: Drew  Davis M.D.   On: 10/08/2015 19:07    Review of Systems  Constitutional: Negative for fever, chills, weight loss and diaphoresis.  HENT: Negative for hearing loss.   Eyes: Negative for photophobia.  Cardiovascular: Negative for chest pain.  Musculoskeletal: Positive for back pain and falls.       Previous left hip hemiarthroplasty for femoral neck fx. Previous lumbar fusion with cemented pedicle screws. Rods have been removed.   Skin: Negative for rash.  Neurological: Negative for dizziness.  Psychiatric/Behavioral: Negative for hallucinations and substance abuse.   Blood pressure 100/50, pulse 72, temperature 98.4 F (36.9 C), temperature source Oral, resp. rate 16, weight 58.968 kg (130 lb), SpO2 100 %. Physical Exam  Constitutional: She is oriented to person, place, and time. She appears well-developed and well-nourished.  HENT:  Head: Normocephalic and atraumatic.  Eyes: EOM are normal. Pupils are equal, round, and reactive to light.  Neck: Normal range of motion. Neck supple. No tracheal deviation present. No thyromegaly present.  Cardiovascular: Normal rate.   GI: Soft. She exhibits no distension.  Musculoskeletal:  Pain with left hip ROM. No shortening . Pulses normal DP and PT  Neurological: She is alert and oriented to person, place, and time. She  has normal reflexes.  Skin: Skin is warm and dry. No erythema.  Psychiatric: She has a normal mood and affect. Her behavior is normal. Judgment and thought content normal.    Assessment/Plan: Left periprothetic IT hip fx.  Plan combination Cable plate fixation device for hip fx stabilization. She has pressfit stem with extensive bone ingrown and to change prothesis would cause extensive destruction to the proximal femur. I discussed recommendations with pt and her sister. She understands and agrees to proceed, risks discussed. Surgery Sunday late AM. NPO  Tria Noguera C 10/09/2015, 1:17 AM      

## 2015-10-09 NOTE — Transfer of Care (Signed)
Immediate Anesthesia Transfer of Care Note  Patient: Cheyenne King  Procedure(s) Performed: Procedure(s): LEFT INTERTROCHANTERIC PERI-PROSTETIC HIP ARTROPLASTY (Left)  Patient Location: PACU  Anesthesia Type:General  Level of Consciousness: sedated  Airway & Oxygen Therapy: Patient Spontanous Breathing and Patient connected to nasal cannula oxygen  Post-op Assessment: Report given to RN and Post -op Vital signs reviewed and stable  Post vital signs: Reviewed and stable  Last Vitals:  Filed Vitals:   10/09/15 0430  BP: 103/51  Pulse: 61  Temp: 36.9 C  Resp: 16    Complications: No apparent anesthesia complications

## 2015-10-09 NOTE — Op Note (Signed)
Cheyenne King NO.:  [id number]  MEDICAL RECORD NO.:  [id number]  LOCATION:  [id number]                        FACILITY:  [facility]  PHYSICIAN:  Monaye Blackie C. [name], M.D.    DATE OF BIRTH:  [birth year]  DATE OF PROCEDURE:  10/09/2015 DATE OF DISCHARGE:                              OPERATIVE REPORT   PREOPERATIVE DIAGNOSIS:  Left intertrochanteric hip fracture.  POSTOPERATIVE DIAGNOSIS:  Left intertrochanteric hip fracture.  PROCEDURE:  Open reduction and internal fixation of left intertrochanteric hip fracture.  SURGEON:  Judythe Postema C. [name], M.D.  ANESTHESIA:  General.  IMPLANTS:  Plate Zimmer cable ready system 105 mm, tension cables x4.  SURGEON:  Kennedy Brines C. [name], M.D.  ANESTHESIA:  General.  ESTIMATED BLOOD LOSS:  Less than 100 mL.  INDICATION:  A 69 year old female, who had previous press-fit, bipolar hemiarthroplasty done in [place], [place].  She fell with an intertrochanteric fracture, unable to walk, and x-rays demonstrate intertrochanteric fracture around the prosthesis with a well-fixed stem.  DESCRIPTION OF PROCEDURE:  After standard prepping and draping with the patient in lateral position after induction of anesthesia, intubation, Cheyenne King VII frame was used for stabilization.  Prepping with DuraPrep was performed. Impervious stockinette, Coban, usual split sheets, drapes were performed.  The patient had a lateral incision that was only slightly biased posteriorly.  This was marked with a skin marker. Betadine and Steri-Drape applied.  Time-out procedure completed and all incisions opened up.  Bovie electrocautery was used.  Tensor fascia was split in line with the old cut.  Charnley retractor was placed. Subcutaneous tissue was elevated off the vastus lateralis.  Vastus tubercle was used for division of the vastus lateralis from anterior- posterior and then splitting along the posterior aspect. Retention stitch was placed in it.  Cable Plate System was  selected, which was the second from the shortest.  Cables were passed proximally and distally. Initially, the proximal cables did not catch the lesser troch piece and had to be re-passed x2 to catch 1 above the lesser trochanter and 1 below the lesser trochanter.  Distal ones were passed with the smaller of the 2 passers had passed around the shaft from posterior to anterior to protect the sciatic nerve staying as close to bone as possible. Cables were tensioned, intermittent C-arm was checked.  The proximal 2 cables had to be taken out, re-passed to catch the lesser troch fragment above as well as the lesser trochanter pulling the fragment medially into the calcar and then tensioned down appropriately.  There was no loosening the stem, no migration, it was stable with rotation.  All cables were tensioned down appropriately.  A screwdriver was used with the screw on top of the cable crushing it down and then cutting the wires.  The position and alignment looked good.  Final spot fluoro pictures were taken.  Irrigation with saline solution.  Repair of the vastus lateralis with #1 Vicryl.  #1 Vicryl in the tensor fascia.  The gluteus was pulled back into good position proximally and repairing the gluteus fascia.  Subcutaneous tissue reapproximated with 2-0, then skin staple closure, postop dressing with Aquacel.  The patient tolerated the procedure well, transferred  to the recovery room and postop x-ray in the PACU showed good reduction.     Cheyenne King, M.D.     MCY/MEDQ  D:  10/09/2015  T:  10/09/2015  Job:  [id number]

## 2015-10-09 NOTE — Progress Notes (Signed)
Received from PACU via bed. Resting comfortably, dressing clean,dry and intact. Tele re-applied,Central Tele called.

## 2015-10-10 ENCOUNTER — Encounter (HOSPITAL_COMMUNITY): Payer: Self-pay | Admitting: Orthopaedic Surgery

## 2015-10-10 DIAGNOSIS — D62 Acute posthemorrhagic anemia: Secondary | ICD-10-CM

## 2015-10-10 DIAGNOSIS — S72142D Displaced intertrochanteric fracture of left femur, subsequent encounter for closed fracture with routine healing: Secondary | ICD-10-CM

## 2015-10-10 LAB — BASIC METABOLIC PANEL
Anion gap: 6 (ref 5–15)
BUN: 11 mg/dL (ref 6–20)
CHLORIDE: 104 mmol/L (ref 101–111)
CO2: 26 mmol/L (ref 22–32)
Calcium: 8.2 mg/dL — ABNORMAL LOW (ref 8.9–10.3)
Creatinine, Ser: 0.98 mg/dL (ref 0.44–1.00)
GFR, EST NON AFRICAN AMERICAN: 57 mL/min — AB (ref >60)
Glucose, Bld: 74 mg/dL (ref 65–99)
POTASSIUM: 4.2 mmol/L (ref 3.5–5.1)
SODIUM: 136 mmol/L (ref 135–145)

## 2015-10-10 LAB — CBC
HCT: 30.4 % — ABNORMAL LOW (ref 36.0–46.0)
HEMOGLOBIN: 9.8 g/dL — AB (ref 12.0–15.0)
MCH: 30.2 pg (ref 26.0–34.0)
MCHC: 32.2 g/dL (ref 30.0–36.0)
MCV: 93.5 fL (ref 78.0–100.0)
PLATELETS: 111 10*3/uL — AB (ref 150–400)
RBC: 3.25 MIL/uL — AB (ref 3.87–5.11)
RDW: 14.3 % (ref 11.5–15.5)
WBC: 5.1 10*3/uL (ref 4.0–10.5)

## 2015-10-10 MED ORDER — POLYSACCHARIDE IRON COMPLEX 150 MG PO CAPS
150.0000 mg | ORAL_CAPSULE | Freq: Two times a day (BID) | ORAL | Status: DC
  Administered 2015-10-10 – 2015-10-12 (×5): 150 mg via ORAL
  Filled 2015-10-10 (×5): qty 1

## 2015-10-10 MED ORDER — OXYCODONE HCL 5 MG PO TABS
5.0000 mg | ORAL_TABLET | ORAL | Status: DC | PRN
  Administered 2015-10-10 – 2015-10-11 (×4): 5 mg via ORAL
  Filled 2015-10-10 (×4): qty 1

## 2015-10-10 NOTE — Progress Notes (Signed)
Subjective: Patient seen earlier this afternoon.  Doing well.  Pain controlled.     Objective: Vital signs in last 24 hours: Temp:  [98 F (36.7 C)-98.1 F (36.7 C)] 98 F (36.7 C) (11/21 1511) Pulse Rate:  [68-103] 103 (11/21 1511) Resp:  [16-18] 17 (11/21 1511) BP: (89-128)/(48-63) 98/52 mmHg (11/21 1511) SpO2:  [100 %] 100 % (11/21 1511) Weight:  [59.1 kg (130 lb 4.7 oz)] 59.1 kg (130 lb 4.7 oz) (11/20 2108)  Intake/Output from previous day: 11/20 0701 - 11/21 0700 In: 1895 [P.O.:120; I.V.:1775] Out: 1390 [Urine:1140; Blood:250] Intake/Output this shift: Total I/O In: 1232.5 [P.O.:960; I.V.:272.5] Out: 200 [Urine:200]   Recent Labs  10/08/15 1819 10/10/15 0621  HGB 13.1 9.8*    Recent Labs  10/08/15 1819 10/10/15 0621  WBC 10.8* 5.1  RBC 4.30 3.25*  HCT 39.7 30.4*  PLT 151 111*    Recent Labs  10/08/15 1819 10/10/15 0621  NA 141 136  K 4.9 4.2  CL 106 104  CO2 29 26  BUN 9 11  CREATININE 0.97 0.98  GLUCOSE 110* 74  CALCIUM 9.4 8.2*    Recent Labs  10/08/15 1819  INR 1.00    Exam:  Alert and oriented.  Very pleasant.  aquacel dressing C/D/I.  bilat calves nontender.  NVI.  Left thigh some swelling but soft.    Assessment/Plan: Continue present care.  PT.  States that she eventually wants to d/c home.     Rockwell Zentz M 10/10/2015, 3:45 PM

## 2015-10-10 NOTE — Evaluation (Signed)
Physical Therapy Evaluation Patient Details Name: Cheyenne King MRN: [id number] DOB: 1946-06-08 Today's Date: 10/10/2015   History of Present Illness  Manasi Dishon is a 69 y.o. female, street of essential hypertension, hypothyroidism, GERD, fibromyalgia, previous left hip prosthesis due to a mechanical fall, previous L-spine surgery chronic low back pain, who lives at home and is fairly mobile presented to [facility] ER after a mechanical fall she sustained at home where she landed on her left hip,. Pt s/p L ORIF to hip 11/20, pt WBAT L LE.  Clinical Impression  Pt admitted with above. Pt tolerated OOB Mobility well for first time since surgery. Pt with soft BP of 91/58 with report of lightheadedness during ambulation. Anticipate pt to progress well enough to be able to return home with sister to provide 24/7 supervision and assist. Acute PT to con't to follow to progress indep with mobility.    Follow Up Recommendations Home health PT;Supervision/Assistance - 24 hour    Equipment Recommendations  None recommended by PT    Recommendations for Other Services       Precautions / Restrictions Precautions Precautions: Fall Restrictions Weight Bearing Restrictions: Yes LLE Weight Bearing: Weight bearing as tolerated      Mobility  Bed Mobility Overal bed mobility: Needs Assistance Bed Mobility: Supine to Sit     Supine to sit: Min assist     General bed mobility comments: v/c's for long sit position, Assist for L LE management, max directional v/c's to used UEs to push into bed to scoot self to EOB  Transfers Overall transfer level: Needs assistance Equipment used: Rolling walker (2 wheeled) Transfers: Sit to/from Stand Sit to Stand: Mod assist         General transfer comment: v/c's for hand placement, assist to power up and transition hands from bed to RW  Ambulation/Gait Ambulation/Gait assistance: Min assist Ambulation Distance (Feet): 40 Feet Assistive device:  Rolling walker (2 wheeled) Gait Pattern/deviations: Step-through pattern;Decreased stride length;Decreased stance time - left Gait velocity: decreased Gait velocity interpretation: <1.8 ft/sec, indicative of risk for recurrent falls General Gait Details: antalgic limp with L LE, limited L LE WBing due to pain. c/o "i'm feeling light headed," BP 91/58 upon return to room  Stairs            Wheelchair Mobility    Modified Rankin (Stroke Patients Only)       Balance Overall balance assessment: Needs assistance   Sitting balance-Leahy Scale: Fair     Standing balance support: Bilateral upper extremity supported Standing balance-Leahy Scale: Poor Standing balance comment: requires UE support due to L LE limited WBing due to pain                             Pertinent Vitals/Pain Pain Assessment: 0-10 Pain Score: 7  Pain Location: L hip - 4/10 at rest, 7/10 with mobility Pain Descriptors / Indicators: Aching Pain Intervention(s): Monitored during session    Home Living Family/patient expects to be discharged to:: Private residence Living Arrangements: Alone Available Help at Discharge: Family;Available 24 hours/day Type of Home: House Home Access: Stairs to enter Entrance Stairs-Rails: Right Entrance Stairs-Number of Steps: 3 Home Layout: One level Home Equipment: Walker - 2 wheels;Grab bars - toilet;Grab bars - tub/shower      Prior Function Level of Independence: Independent         Comments: pt was gardening and caring for home, driving, ambulating without AD  Hand Dominance   Dominant Hand: Right    Extremity/Trunk Assessment   Upper Extremity Assessment: Overall WFL for tasks assessed           Lower Extremity Assessment: LLE deficits/detail   LLE Deficits / Details: able to tolerate AAROM to about 45 deg hip flexion  Cervical / Trunk Assessment: Normal  Communication   Communication: No difficulties  Cognition  Arousal/Alertness: Awake/alert Behavior During Therapy: WFL for tasks assessed/performed Overall Cognitive Status: Within Functional Limits for tasks assessed                      General Comments      Exercises General Exercises - Lower Extremity Ankle Circles/Pumps: AROM;Both;10 reps Quad Sets: AROM;Left;10 reps;Supine (with 5 sec hold) Heel Slides: AAROM;Left;10 reps      Assessment/Plan    PT Assessment Patient needs continued PT services  PT Diagnosis Difficulty walking;Acute pain   PT Problem List Decreased strength;Decreased range of motion;Decreased activity tolerance;Decreased balance;Decreased mobility;Decreased coordination  PT Treatment Interventions DME instruction;Gait training;Stair training;Functional mobility training;Therapeutic activities;Therapeutic exercise;Balance training   PT Goals (Current goals can be found in the Care Plan section) Acute Rehab PT Goals Patient Stated Goal: home this week PT Goal Formulation: With patient Time For Goal Achievement: 10/17/15 Potential to Achieve Goals: Good    Frequency Min 5X/week   Barriers to discharge        Co-evaluation               End of Session Equipment Utilized During Treatment: Gait belt Activity Tolerance: Patient tolerated treatment well Patient left: in chair;with call bell/phone within reach Nurse Communication: Mobility status         Time: [id number] PT Time Calculation (min) (ACUTE ONLY): 41 min   Charges:   PT Evaluation $Initial PT Evaluation Tier I: 1 Procedure PT Treatments $Gait Training: 8-22 mins $Therapeutic Exercise: 8-22 mins   PT G CodesMarcene Brawn:        Kyomi Hector Marie 10/10/2015, 8:37 AM   Lewis ShockAshly Leightyn Cina, PT, DPT Pager #: [phone] Office #: [phone]

## 2015-10-10 NOTE — Progress Notes (Signed)
[facility] PROGRESS NOTE  Cheyenne SchmidJoann King ZOX:[id number] DOB: 11/10/1946 DOA: 10/08/2015 PCP: Toma DeitersXAJE A HASANAJ, MD  Assessment/Plan: 1. Mechanical fall with left periprosthetic hip fracture.  -s/p ORIF -well tolerated overall. -will follow ortho rec's; assess with PT/OT capacity and degree of assistance for safe discharge -continue PRN supportive care and analgesics  -will follow Hgb trend after surgery   2. Essential hypertension. Soft, but stable -will hold on cozaar as patient BP is soft and she felt lightheaded when standing   3. Hypothyroid: continue synthroid   4. GERD. Continue Zantac   5.leukocytosis: due to demargination most likely; no signs of infection reported or appreciated  6. Acute blood loss anemia -due to surgery most likely -Hgb 9.8 -will follow trend -No transfusion at this moment -will start niferex   Code Status: Full Family Communication: sister at bedside Disposition Plan: to be determine; PT/OT to evaluate patient capacity and needs; continue supportive care and PRN analgesics   Consultants:  Orthopedic surgery (Dr. Ophelia CharterYates)  Procedures: Open reduction and internal fixation of left intertrochanteric hip fracture. 10/09/15  Antibiotics:  None   HPI/Subjective: Afebrile, no CP, no SOB. AAOX3. Patient with pain on her left hip. Lightheaded and with soft BP when ambulating.   Objective: Filed Vitals:   10/10/15 1033 10/10/15 1511  BP: 108/48 98/52  Pulse:  103  Temp: 98 F (36.7 C) 98 F (36.7 C)  Resp: 18 17    Intake/Output Summary (Last 24 hours) at 10/10/15 1513 Last data filed at 10/10/15 1418  Gross per 24 hour  Intake 1727.5 ml  Output   1190 ml  Net  537.5 ml   Filed Weights   10/08/15 2136 10/09/15 2108  Weight: 58.968 kg (130 lb) 59.1 kg (130 lb 4.7 oz)    Exam:   General:  Afebrile, denies CP or SOB. Complaining of pain in her left hip. Feeling lightheaded when ambulating. No fever    Cardiovascular: RRR, no  rubs, no gallops  Respiratory: good air movement, no wheezing, no crackles  Abdomen: soft, NT, ND, positive BS  Musculoskeletal: no cyanosis; pulses appreciated on exam; reports left leg is numb and with mild discomfort   Data Reviewed: Basic Metabolic Panel:  Recent Labs Lab 10/08/15 1819 10/10/15 0621  NA 141 136  K 4.9 4.2  CL 106 104  CO2 29 26  GLUCOSE 110* 74  BUN 9 11  CREATININE 0.97 0.98  CALCIUM 9.4 8.2*  MG 2.1  --    CBC:  Recent Labs Lab 10/08/15 1819 10/10/15 0621  WBC 10.8* 5.1  HGB 13.1 9.8*  HCT 39.7 30.4*  MCV 92.3 93.5  PLT 151 111*   Studies: Dg Lumbar Spine 1 View  10/08/2015  CLINICAL DATA:  Fall. Low back pain. Prior lumbar spine surgery in 2014. EXAM: LUMBAR SPINE - 1 VIEW COMPARISON:  09/1915 AP lumbar spine radiograph. FINDINGS: This report assumes 5 non rib-bearing lumbar vertebrae. There is a moderate L1 vertebral body compression fracture of indeterminate chronicity, with approximately 50% anterior loss of vertebral body height. The L2, L3 and L5 vertebral body heights appear preserved. There is mild approximately 30% loss of height of the L4 vertebral body of indeterminate chronicity. There is 5 mm anterolisthesis at L4-5. Pedicle screws are noted overlying the L3, L4 and L5 vertebral levels, with no evidence of hardware fracture or loosening. Atherosclerotic calcifications throughout the abdominal aorta. Partially visualized hip arthroplasty hardware. Mild degenerative disc disease at T12-L1 and L5-S1. No aggressive appearing focal osseous  lesions. IMPRESSION: 1. Moderate L1 and mild L4 vertebral body compression fractures of indeterminate chronicity. 2. Mild 5 mm anterolisthesis at L4-5. 3. Pedicle screws at L3 through L5, with no evidence of hardware complication. Electronically Signed   By: Delbert Phenix M.D.   On: 10/08/2015 20:40   Dg Lumbar Spine 1 View  10/08/2015  CLINICAL DATA:  Low back pain. EXAM: LUMBAR SPINE - 1 VIEW COMPARISON:   None. FINDINGS: Single AP view of the lumbar spine is provided. There is methylmethacrylate from prior vertebral body augmentation in the L4, L5 and S1 vertebral bodies with bilateral pedicle screws at each level. There are compression deformities of the L2 and L3 vertebral bodies. There is generalized osteopenia. IMPRESSION: Prior vertebral body augmentation at L4, L5 and S1 with bilateral pedicle screws at each level. Compression deformities of the L2 and L3 vertebral bodies on a single AP view. Recommend further evaluation with a lateral view of the lumbar spine. Electronically Signed   By: Elige Ko   On: 10/08/2015 19:08   Pelvis Portable  10/09/2015  CLINICAL DATA:  Repair of hip fracture EXAM: PORTABLE PELVIS 1-2 VIEWS COMPARISON:  10/08/2015 FINDINGS: Left hip hemiarthroplasty. Superimposed fracture proximal femur has been fixed with a lateral plate and cerclage wires. No dislocation. No other fracture Pedicle screws bilaterally at L3, L4, L5 without connecting rods. IMPRESSION: Left hip hemiarthroplasty. Proximal femur fracture has been fixed with a lateral plate and cerclage wires. Electronically Signed   By: Marlan Palau M.D.   On: 10/09/2015 13:08   Dg Pelvis Portable  10/08/2015  CLINICAL DATA:  Low back pain EXAM: PORTABLE PELVIS 1-2 VIEWS COMPARISON:  None. FINDINGS: There is generalized osteopenia. There is a left hip arthroplasty. There is no acute fracture or dislocation. 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 There are degenerative changes of bilateral SI joints. There is prior augmentation of the L4, L5 and S1 vertebral bodies. IMPRESSION: No acute osseous injury of the pelvis. Electronically Signed   By: Elige Ko   On: 10/08/2015 19:08   Dg Chest Port 1 View  10/08/2015  CLINICAL DATA:  Preoperative evaluation.  Left hip fracture. EXAM: PORTABLE CHEST 1 VIEW COMPARISON:  None. FINDINGS: Patient is rotated to the left, limiting evaluation. Normal cardiac and  mediastinal contours given patient rotation. No large area of pulmonary consolidation. No pleural effusion or pneumothorax. IMPRESSION: No acute cardiopulmonary process. Electronically Signed   By: Annia Belt M.D.   On: 10/08/2015 19:07   Dg Hip Operative Unilat With Pelvis Left  10/09/2015  CLINICAL DATA:  Left intertrochanteric periprosthetic hip arthroplasty. EXAM: OPERATIVE LEFT HIP (WITH PELVIS IF PERFORMED)  VIEWS TECHNIQUE: Fluoroscopic spot image(s) were submitted for interpretation post-operatively. COMPARISON:  Radiograph dated 10/08/2015. FINDINGS: Intraoperative fluoroscopic images demonstrate placement of side plate and cerclage wire fixation around left proximal femur pre-existing long stem component of total hip arthroplasty. The alignment is anatomic. Fluoroscopy time is recorded as 12.6 seconds. IMPRESSION: Intraoperative images from left intertrochanteric periprostatic hip arthroplasty. No evidence of immediate complications. Electronically Signed   By: Ted Mcalpine M.D.   On: 10/09/2015 11:03    Scheduled Meds: . aspirin EC  325 mg Oral Q breakfast  . docusate sodium  100 mg Oral BID  . famotidine  10 mg Oral Daily  . heparin  5,000 Units Subcutaneous 3 times per day  . levothyroxine  50 mcg Oral QAC breakfast   Continuous Infusions: . sodium chloride Stopped (10/10/15 1038)  Principal Problem:   Femur fracture, left (HCC) Active Problems:   GERD (gastroesophageal reflux disease)   Hypothyroidism   Osteoporosis   Fibromyalgia   Essential hypertension   Hip fracture (HCC)   Low back pain   Intertrochanteric fracture of left hip (HCC)    Time spent: 30 minutes    Vassie Loll  [facility] Pager [phone]. If 7PM-7AM, please contact night-coverage at [email or web address], password [facility] 10/10/2015, 3:13 PM  LOS: 2 days

## 2015-10-10 NOTE — Care Management Note (Signed)
Case Management Note  Patient Details  Name: Cheyenne King MRN: [id number] Date of Birth: [birth year]  Subjective/Objective:    CM following for progression and d/c planning.                Action/Plan: 10/10/2015 Met with pt re [place] needs, pt wishes to use [facility] for [facility], [place] and [place]. She has used this service before and was very pleased with her care. This pt lives in [place]. Her sister will who lives very near her will move in with her during her recovery to assist with care. [facility] notified and info faxed. Await [place] orders from MD. Due to approaching holiday [facility] services will not be available until Sat. Oct 15, 2015. Pt has a walker from previous surgery , however 3:1 ordered. Expected Discharge Date:  10/12/15               Expected Discharge Plan:  [place]  In-House Referral:  NA  Discharge planning Services  CM Consult  Post Acute Care Choice:  Durable Medical Equipment Choice offered to:  Patient  DME Arranged:  3-N-1 DME Agency:  [facility] Arranged:  RN, PT, OT [facility] Agency:  [facility]  Status of Service:  In process, will continue to follow  Medicare Important Message Given:    Date Medicare IM Given:    Medicare IM give by:    Date Additional Medicare IM Given:    Additional Medicare Important Message give by:     If discussed at [place] of Stay Meetings, dates discussed:    Additional Comments:  Adron Bene, RN 10/10/2015, 3:58 PM

## 2015-10-10 NOTE — Progress Notes (Signed)
   10/10/15 1612  Clinical Encounter Type  Visited With Patient  Visit Type Initial;Spiritual support  Referral From Patient   Chaplain responded to a request from the patient to visit and offer prayer. Chaplain facilitated life review and offered prayer. Chaplain support available as needed.   Alda Ponderdam M Jadie Allington, Chaplain 10/10/2015 4:13 PM

## 2015-10-11 LAB — CBC
HCT: 28.9 % — ABNORMAL LOW (ref 36.0–46.0)
Hemoglobin: 9.6 g/dL — ABNORMAL LOW (ref 12.0–15.0)
MCH: 30.9 pg (ref 26.0–34.0)
MCHC: 33.2 g/dL (ref 30.0–36.0)
MCV: 92.9 fL (ref 78.0–100.0)
PLATELETS: 114 10*3/uL — AB (ref 150–400)
RBC: 3.11 MIL/uL — ABNORMAL LOW (ref 3.87–5.11)
RDW: 14.3 % (ref 11.5–15.5)
WBC: 5.9 10*3/uL (ref 4.0–10.5)

## 2015-10-11 LAB — BASIC METABOLIC PANEL
ANION GAP: 5 (ref 5–15)
BUN: 9 mg/dL (ref 6–20)
CALCIUM: 8.7 mg/dL — AB (ref 8.9–10.3)
CO2: 30 mmol/L (ref 22–32)
CREATININE: 0.91 mg/dL (ref 0.44–1.00)
Chloride: 101 mmol/L (ref 101–111)
Glucose, Bld: 102 mg/dL — ABNORMAL HIGH (ref 65–99)
Potassium: 4.2 mmol/L (ref 3.5–5.1)
SODIUM: 136 mmol/L (ref 135–145)

## 2015-10-11 MED ORDER — KETOROLAC TROMETHAMINE 30 MG/ML IJ SOLN
30.0000 mg | Freq: Three times a day (TID) | INTRAMUSCULAR | Status: DC | PRN
  Administered 2015-10-11: 30 mg via INTRAVENOUS
  Filled 2015-10-11: qty 1

## 2015-10-11 MED ORDER — ACETAMINOPHEN 325 MG PO TABS
650.0000 mg | ORAL_TABLET | Freq: Three times a day (TID) | ORAL | Status: DC
  Administered 2015-10-11 – 2015-10-12 (×3): 650 mg via ORAL
  Filled 2015-10-11 (×3): qty 2

## 2015-10-11 MED ORDER — POLYETHYLENE GLYCOL 3350 17 G PO PACK
17.0000 g | PACK | Freq: Two times a day (BID) | ORAL | Status: DC
  Administered 2015-10-11 – 2015-10-12 (×3): 17 g via ORAL
  Filled 2015-10-11 (×3): qty 1

## 2015-10-11 NOTE — Progress Notes (Signed)
Occupational Therapy Treatment Patient Details Name: Eureka Valdes MRN: [id number] DOB: 30-Sep-1946 Today's Date: 10/11/2015    History of present illness Patriciaann Rabanal is a 69 y.o. female, street of essential hypertension, hypothyroidism, GERD, fibromyalgia, previous left hip prosthesis due to a mechanical fall, previous L-spine surgery chronic low back pain, who lives at home and is fairly mobile presented to [facility] ER after a mechanical fall she sustained at home where she landed on her left hip,. Pt s/p L ORIF to hip 11/20, pt WBAT L LE.   OT comments  Pt progressed to bathroom without any symptoms and voided bladder. PT reports feeling much better this PM compared to the AM attempt. Recommending CIR consult per MD and PT current request.    Follow Up Recommendations  CIR    Equipment Recommendations  3 in 1 bedside comode;Other (comment) (RW)    Recommendations for Other Services      Precautions / Restrictions Precautions Precautions: Fall Precaution Comments: feeling faint during ambulation Restrictions Weight Bearing Restrictions: No LLE Weight Bearing: Weight bearing as tolerated       Mobility Bed Mobility Overal bed mobility: Needs Assistance Bed Mobility: Supine to Sit     Supine to sit: Min assist     General bed mobility comments: in chair on arrival  Transfers Overall transfer level: Needs assistance Equipment used: Rolling walker (2 wheeled) Transfers: Sit to/from Stand Sit to Stand: Min assist         General transfer comment: increase time required    Balance Overall balance assessment: Needs assistance         Standing balance support: No upper extremity supported;During functional activity Standing balance-Leahy Scale: Fair Standing balance comment: stood x1 min for hygiene s/p urinary incontinence                   ADL Overall ADL's : Needs assistance/impaired     Grooming: Wash/dry hands;Min Personnel officer: Maximal assistance;Regular Toilet;Grab bars;RW Statistician Details (indicate cue type and reason): Pt required max cues for L Le extended and lowering to seat surface Toileting- Clothing Manipulation and Hygiene: Sit to/from stand;Maximal assistance Toileting - Clothing Manipulation Details (indicate cue type and reason): pt required max (A) to complete sit <>Stand and recommend 3n1 for home and use in hospital. Pt with difficulty from low surface     Functional mobility during ADLs: Moderate assistance;Rolling walker General ADL Comments: Pt demonstrates ambulation and toilet transfer without dizziness or faint feeling this session. pt very talkive and redirected to task often during session. pt recalling all information to therpaist provided by medical staff from the entire morning. pt very focused on Dr Gwenlyn Perking information and rehab . pt educated that CIR is not the department that OT / PT Ashly work for currently.       Vision                     Perception     Praxis      Cognition   Behavior During Therapy: WFL for tasks assessed/performed Overall Cognitive Status: Within Functional Limits for tasks assessed                       Extremity/Trunk Assessment               Exercises General Exercises - Lower Extremity Ankle Circles/Pumps:  AROM;Both;10 reps Quad Sets: AROM;Left;10 reps;Supine Heel Slides: AAROM;Left;10 reps   Shoulder Instructions       General Comments      Pertinent Vitals/ Pain       Pain Assessment: No/denies pain Pain Score: 8  Pain Location: surgical site Pain Descriptors / Indicators: Constant Pain Intervention(s): Monitored during session  Home Living                                          Prior Functioning/Environment              Frequency Min 2X/week     Progress Toward Goals  OT Goals(current goals can now be found in the care plan section)  Progress  towards OT goals: Progressing toward goals  Acute Rehab OT Goals Patient Stated Goal: to get therapy before I go home OT Goal Formulation: With patient Time For Goal Achievement: 10/24/15 Potential to Achieve Goals: Good ADL Goals Pt Will Perform Grooming: with supervision;standing Pt Will Perform Upper Body Bathing: with supervision;sitting Pt Will Perform Lower Body Bathing: with min guard assist;sit to/from stand Pt Will Transfer to Toilet: with supervision;ambulating;bedside commode Pt Will Perform Tub/Shower Transfer: Shower transfer;with min guard assist;rolling walker;3 in 1;ambulating  Plan Discharge plan remains appropriate    Co-evaluation                 End of Session Equipment Utilized During Treatment: Gait belt;Rolling walker   Activity Tolerance Patient tolerated treatment well   Patient Left in chair;with call bell/phone within reach   Nurse Communication Mobility status;Precautions        Time: 1143-1200 OT Time Calculation (min): 17 min  Charges: OT General Charges $OT Visit: 1 Procedure OT Treatments $Self Care/Home Management : 8-22 mins  Boone MasterJones, Marykay Mccleod B 10/11/2015, 1:37 PM Pager: [phone]

## 2015-10-11 NOTE — Progress Notes (Signed)
Subjective: 2 Days Post-Op Procedure(s) (LRB): LEFT INTERTROCHANTERIC  ORIF   (Left) Patient reports pain as mild and moderate.    Objective: Vital signs in last 24 hours: Temp:  [98 F (36.7 C)-99.8 F (37.7 C)] 98.6 F (37 C) (11/22 0647) Pulse Rate:  [98-103] 98 (11/22 0647) Resp:  [17-19] 19 (11/22 0647) BP: (81-121)/(48-68) 101/68 mmHg (11/22 0647) SpO2:  [94 %-100 %] 95 % (11/22 0647) Weight:  [60.555 kg (133 lb 8 oz)] 60.555 kg (133 lb 8 oz) (11/21 2037)  Intake/Output from previous day: 11/21 0701 - 11/22 0700 In: 1472.5 [P.O.:1200; I.V.:272.5] Out: 450 [Urine:450] Intake/Output this shift: Total I/O In: -  Out: 300 [Urine:300]   Recent Labs  10/08/15 1819 10/10/15 0621  HGB 13.1 9.8*    Recent Labs  10/08/15 1819 10/10/15 0621  WBC 10.8* 5.1  RBC 4.30 3.25*  HCT 39.7 30.4*  PLT 151 111*    Recent Labs  10/10/15 0621 10/11/15 0414  NA 136 136  K 4.2 4.2  CL 104 101  CO2 26 30  BUN 11 9  CREATININE 0.98 0.91  GLUCOSE 74 102*  CALCIUM 8.2* 8.7*    Recent Labs  10/08/15 1819  INR 1.00    Neurologically intact  Assessment/Plan: 2 Days Post-Op Procedure(s) (LRB): LEFT INTERTROCHANTERIC  ORIF   (Left) Up with therapy  Jocie Meroney C 10/11/2015, 7:35 AM

## 2015-10-11 NOTE — Progress Notes (Signed)
OT EVALUATION  ( late entry)   10/10/15 1000  OT Visit Information  Last OT Received On 10/10/15  Assistance Needed +1  History of Present Illness Cheyenne King is a 69 y.o. 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 Pt s/p L ORIF to hip 11/20, pt WBAT L LE.  Precautions  Precautions Fall  Restrictions  LLE Weight Bearing WBAT  Home Living  Family/patient expects to be discharged to: Private residence  Living Arrangements Alone  Available Help at Discharge Family;Available 24 hours/day  Type of Home House  Home Access Stairs to enter  Entrance Stairs-Number of Steps 3  Entrance Stairs-Rails Right  Home Layout One level  Bathroom Shower/Tub Walk-in IT trainer - 2 wheels;Grab bars - toilet;Grab bars - tub/shower  Prior Function  Level of Independence Independent  Comments pt was gardening and caring for home, driving, ambulating without AD  Communication  Communication No difficulties  Pain Assessment  Pain Assessment 0-10  Pain Score 10  Pain Location surgical site  Pain Descriptors / Indicators Constant  Pain Intervention(s) Monitored during session;Repositioned  Cognition  Arousal/Alertness Awake/alert  Behavior During Therapy WFL for tasks assessed/performed  Overall Cognitive Status Within Functional Limits for tasks assessed  Upper Extremity Assessment  Upper Extremity Assessment Overall WFL for tasks assessed  Lower Extremity Assessment  Lower Extremity Assessment Defer to PT evaluation  Cervical / Trunk Assessment  Cervical / Trunk Assessment Normal  ADL  Overall ADL's  Needs assistance/impaired  Eating/Feeding Details (indicate cue type and  reason) A patient with calling for a breakfast tray now that diet has been increased. pt on heart healthy diet and not happy about restrictions  Grooming Wash/dry face;Modified independent  Lower Body Dressing Moderate assistance;Sit to/from stand  Lower Body Dressing Details (indicate cue type and reason) pt educated on dressing L LE . sister reports being able to (A) at home  Toilet Transfer Details (indicate cue type and reason) educated that OT recommends bedside commode due to d/c BP with PT Ashly this AM and fall risk  General ADL Comments Pt reports pain 10/10 with leg in dependent position in chair . Pt with BP 121/63 currently but pt reports "dizziness and faint"  Vision- History  Baseline Vision/History Wears glasses  Wears Glasses Reading only  Patient Visual Report No change from baseline  Bed Mobility  General bed mobility comments in chair on arrival  Balance  Sitting balance-Leahy Scale Fair  OT - End of Session  Activity Tolerance Patient limited by pain  Patient left in chair;with call bell/phone within reach;with family/visitor present  Nurse Communication Mobility status;Precautions  OT Assessment  OT Therapy Diagnosis  Generalized weakness;Acute pain  OT Recommendation/Assessment Patient needs continued OT Services  OT Problem List Decreased strength;Impaired balance (sitting and/or standing);Decreased activity tolerance;Decreased safety awareness;Decreased knowledge of use of DME or AE;Decreased knowledge of precautions;Pain  OT Plan  OT Frequency (ACUTE ONLY) Min 2X/week  OT Treatment/Interventions (ACUTE ONLY) Self-care/ADL training;Therapeutic exercise;DME and/or AE instruction;Therapeutic activities;Patient/family education;Balance training  OT Recommendation  Follow Up Recommendations Home health OT;Supervision/Assistance - 24 hour  OT Equipment None recommended by OT  Individuals Consulted  Consulted and Agree with Results and Recommendations Patient;Family  member/caregiver  Family  Member Consulted sister  Acute Rehab OT Goals  Patient Stated Goal home this week  OT Goal Formulation With patient  Time For Goal Achievement 10/24/15  Potential to Achieve Goals Good  OT Time Calculation  OT Start Time (ACUTE ONLY) 0928  OT Stop Time (ACUTE ONLY) 1000  OT Time Calculation (min) 32 min  OT General Charges  $OT Visit 1 Procedure  OT Evaluation  $Initial OT Evaluation Tier I 1 Procedure  OT Treatments  $Self Care/Home Management  8-22 mins  Written Expression  Dominant Hand Right     Mateo FlowJones, Brynn   OTR/L Pager: [phone] Office: [phone] .

## 2015-10-11 NOTE — Progress Notes (Signed)
Physical Therapy Treatment Patient Details Name: Cheyenne SchmidJoann Clarey MRN: [id number] DOB: 05/13/1946 Today's Date: 10/11/2015    History of Present Illness Cheyenne King is a 69 y.o. female, street of essential hypertension, hypothyroidism, GERD, fibromyalgia, previous left hip prosthesis due to a mechanical fall, previous L-spine surgery chronic low back pain, who lives at home and is fairly mobile presented [facility] after a mechanical fall she sustained at home where she landed on her left hip,. Pt s/p L ORIF to hip 11/20, pt WBAT L LE.    PT Comments    Pt ambulation limited by drop in BP/feeling faint. S/p 5 min of episode pt BP 106/52. Unable to get sooner due to having to get rolling chair from desk to prevent pt from falling and then getting pt back into room. Pt con't to have increased pain and limited active L LE mvmt. Pt does have 24/7 assist at home via sister however unsure if sister can provide physical assist to pt. Recommending CIR to achieve supervision level of function and improve ambulation tolerance for safe transition home with sister.  Follow Up Recommendations  CIR     Equipment Recommendations  None recommended by PT    Recommendations for Other Services Rehab consult     Precautions / Restrictions Precautions Precautions: Fall Precaution Comments: feeling faint during ambulation Restrictions Weight Bearing Restrictions: No LLE Weight Bearing: Weight bearing as tolerated    Mobility  Bed Mobility Overal bed mobility: Needs Assistance Bed Mobility: Supine to Sit     Supine to sit: Min assist     General bed mobility comments: assist for L LE due to pain, use of bed rail  Transfers Overall transfer level: Needs assistance Equipment used: Rolling walker (2 wheeled) Transfers: Sit to/from Stand Sit to Stand: Min assist         General transfer comment: increased time, v/c's for hand placement  Ambulation/Gait Ambulation/Gait assistance: Min  assist Ambulation Distance (Feet): 20 Feet Assistive device: Rolling walker (2 wheeled) Gait Pattern/deviations: Step-to pattern;Decreased stance time - left;Antalgic Gait velocity: decreased Gait velocity interpretation: <1.8 ft/sec, indicative of risk for recurrent falls General Gait Details: s/p 20 feet pt reports "I feel sick, like i'm going to faint" "i see dark spots" pt started staggering backward requiring immeadiate sitting down in chair   Stairs            Wheelchair Mobility    Modified Rankin (Stroke Patients Only)       Balance Overall balance assessment: Needs assistance         Standing balance support: Bilateral upper extremity supported Standing balance-Leahy Scale: Poor Standing balance comment: stood x1 min for hygiene s/p urinary incontinence                    Cognition Arousal/Alertness: Awake/alert Behavior During Therapy: WFL for tasks assessed/performed Overall Cognitive Status: Within Functional Limits for tasks assessed                      Exercises General Exercises - Lower Extremity Ankle Circles/Pumps: AROM;Both;10 reps Quad Sets: AROM;Left;10 reps;Supine Heel Slides: AAROM;Left;10 reps    General Comments        Pertinent Vitals/Pain Pain Assessment: 0-10 Pain Score: 8  Pain Location: surgical site Pain Descriptors / Indicators: Constant Pain Intervention(s): Monitored during session    Home Living  Prior Function            PT Goals (current goals can now be found in the care plan section) Progress towards PT goals: Progressing toward goals    Frequency  Min 5X/week    PT Plan Discharge plan needs to be updated    Co-evaluation             End of Session Equipment Utilized During Treatment: Gait belt Activity Tolerance: Treatment limited secondary to medical complications (Comment) (orthostatic BP) Patient left: in chair;with call bell/phone within reach      Time: 1004-1032 PT Time Calculation (min) (ACUTE ONLY): 28 min  Charges:  $Gait Training: 8-22 mins $Therapeutic Exercise: 8-22 mins                    G Codes:      Marcene Brawn 10/11/2015, 11:33 AM   Lewis Shock, PT, DPT Pager #: [phone] Office #: [phone]

## 2015-10-11 NOTE — Progress Notes (Signed)
[facility] PROGRESS NOTE  Cheyenne King EAV:[id number] DOB: 1946-08-15 DOA: 10/08/2015 PCP: Cheyenne Deiters, MD  Assessment/Plan: 1. Mechanical fall with left periprosthetic hip fracture.  -s/p ORIF -well tolerated overall. -will follow ortho rec's; assess with PT/OT capacity and degree of assistance for safe discharge -continue PRN supportive care and analgesics (using tylenol TID and PRN toradol)  -will follow Hgb trend after surgery   2. Essential hypertension. Soft, but stable -will continue holding cozaar as patient BP is soft and she felt lightheaded when standing   3. Hypothyroid: continue synthroid   4. GERD. Continue Zantac   5.leukocytosis: due to demargination most likely; no signs of infection reported or appreciated -repeat CBC with normal white blood cells count  6. Acute blood loss anemia -due to surgery most likely -Hgb 9.6 -will follow trend and if despite avoiding pain meds she continue experiencing symptoms of orthostatic changes will transfuse her. -will continue niferex  Code Status: Full Family Communication: sister at bedside Disposition Plan: to be determine; PT/OT to evaluate patient capacity and needs; continue supportive care and PRN analgesics   Consultants:  Orthopedic surgery (Dr. Ophelia King)  Procedures: Open reduction and internal fixation of left intertrochanteric hip fracture. 10/09/15  Antibiotics:  None   HPI/Subjective: Afebrile, no CP, no SOB. AAOX3. Patient with pain on her left hip and experiencing lightheadedness with ambulation.   Objective: Filed Vitals:   10/11/15 0813 10/11/15 1705  BP: 106/52 119/65  Pulse: 82 95  Temp: 97.9 F (36.6 C) 97.5 F (36.4 C)  Resp: 18 18    Intake/Output Summary (Last 24 hours) at 10/11/15 1748 Last data filed at 10/11/15 1526  Gross per 24 hour  Intake    840 ml  Output    400 ml  Net    440 ml   Filed Weights   10/08/15 2136 10/09/15 2108 10/10/15 2037  Weight: 58.968 kg  (130 lb) 59.1 kg (130 lb 4.7 oz) 60.555 kg (133 lb 8 oz)    Exam:   General:  Afebrile, denies CP or SOB. Complaining of pain in her left hip. Patient still with orthostatic changes and lightheaded feeling when ambulating. No fever    Cardiovascular: RRR, no rubs, no gallops  Respiratory: good air movement, no wheezing, no crackles  Abdomen: soft, NT, ND, positive BS  Musculoskeletal: no cyanosis; pulses appreciated on exam; reports left leg is numb and with mild discomfort   Data Reviewed: Basic Metabolic Panel:  Recent Labs Lab 10/08/15 1819 10/10/15 0621 10/11/15 0414  NA 141 136 136  K 4.9 4.2 4.2  CL 106 104 101  CO2 GLUCOSE 110* 74 102*  BUN CREATININE 0.97 0.98 0.91  CALCIUM 9.4 8.2* 8.7*  MG 2.1  --   --    CBC:  Recent Labs Lab 10/08/15 1819 10/10/15 0621 10/11/15 0414  WBC 10.8* 5.1 5.9  HGB 13.1 9.8* 9.6*  HCT 39.7 30.4* 28.9*  MCV 92.3 93.5 92.9  PLT 151 111* 114*   Studies: No results found.  Scheduled Meds: . acetaminophen  650 mg Oral 3 times per day  . aspirin EC  325 mg Oral Q breakfast  . docusate sodium  100 mg Oral BID  . famotidine  10 mg Oral Daily  . heparin  5,000 Units SubcutaKareen Jefferysimes per day  . iron polysaccharides  150 mg Oral BID  . levothyroxine  50 mcg Oral QAC breakfast  . polyethylene glycol  17 g  Oral BID   Continuous Infusions: . sodium chloride Stopped (10/10/15 1038)    Principal Problem:   Femur fracture, left (HCC) Active Problems:   GERD (gastroesophageal reflux disease)   Hypothyroidism   Osteoporosis   Fibromyalgia   Essential hypertension   Hip fracture (HCC)   Low back pain   Intertrochanteric fracture of left hip (HCC)    Time spent: 30 minutes    Cheyenne King, Cheyenne King  [facility] Pager [phone]. If 7PM-7AM, please contact night-coverage at [email or web address], password [email or web address] 10/11/2015, 5:48 PM  LOS: 3 days

## 2015-10-11 NOTE — Clinical Social Work Note (Signed)
CSW received consult regarding SNF placement. Consult placed today regarding inpatient rehab and nurse case manager has talked with patient and home health services arranged if CIR declines patient.  CSW will continue to follow and assist with d/c disposition if needed.   Genelle BalVanessa Loistine Eberlin, MSW, LCSW Licensed Clinical Social Worker Clinical Social Work Department [facility] [phone]

## 2015-10-11 NOTE — Progress Notes (Signed)
Rehab Admissions Coordinator Note:  Patient was screened by Trish MageLogue, Bingham Millette M for appropriateness for an Inpatient Acute Rehab Consult.  At this time, we are recommending Inpatient Rehab consult.  Trish MageLogue, Mckinzy Fuller M 10/11/2015, 2:57 PM  I can be reached at [phone].

## 2015-10-11 NOTE — Care Management Important Message (Signed)
Important Message  Patient Details  Name: Cheyenne King MRN: [id number] Date of Birth: 06/21/1946   Medicare Important Message Given:  Yes    Zaidy Absher P Aser Nylund 10/11/2015, 3:06 PM

## 2015-10-11 NOTE — Progress Notes (Signed)
[facility]  CONSULT SERVICE NOTE  Pt's chart reviewed. S/p left periprosthetic hip fracture, POD #2. Other than mild anemia there is not much else medically going on to justify inpatient rehab. Furthermore, patient ambulated 20' min assist on evaluation. Recommend SNF (which patient refuses) vs HH PT/OT.  Ranelle OysterZachary T. Swartz, MD, [facility] 10/11/2015

## 2015-10-12 DIAGNOSIS — M80852A Other osteoporosis with current pathological fracture, left femur, initial encounter for fracture: Secondary | ICD-10-CM | POA: Diagnosis not present

## 2015-10-12 DIAGNOSIS — S7292XD Unspecified fracture of left femur, subsequent encounter for closed fracture with routine healing: Secondary | ICD-10-CM | POA: Diagnosis not present

## 2015-10-12 DIAGNOSIS — K219 Gastro-esophageal reflux disease without esophagitis: Secondary | ICD-10-CM | POA: Diagnosis not present

## 2015-10-12 DIAGNOSIS — I10 Essential (primary) hypertension: Secondary | ICD-10-CM | POA: Diagnosis not present

## 2015-10-12 DIAGNOSIS — E038 Other specified hypothyroidism: Secondary | ICD-10-CM | POA: Diagnosis not present

## 2015-10-12 DIAGNOSIS — S72002D Fracture of unspecified part of neck of left femur, subsequent encounter for closed fracture with routine healing: Secondary | ICD-10-CM | POA: Diagnosis not present

## 2015-10-12 DIAGNOSIS — M545 Low back pain: Secondary | ICD-10-CM | POA: Diagnosis not present

## 2015-10-12 DIAGNOSIS — M6281 Muscle weakness (generalized): Secondary | ICD-10-CM | POA: Diagnosis not present

## 2015-10-12 DIAGNOSIS — M5136 Other intervertebral disc degeneration, lumbar region: Secondary | ICD-10-CM | POA: Diagnosis not present

## 2015-10-12 DIAGNOSIS — M25552 Pain in left hip: Secondary | ICD-10-CM | POA: Diagnosis not present

## 2015-10-12 DIAGNOSIS — M797 Fibromyalgia: Secondary | ICD-10-CM | POA: Diagnosis not present

## 2015-10-12 DIAGNOSIS — E039 Hypothyroidism, unspecified: Secondary | ICD-10-CM | POA: Diagnosis not present

## 2015-10-12 DIAGNOSIS — R2689 Other abnormalities of gait and mobility: Secondary | ICD-10-CM | POA: Diagnosis not present

## 2015-10-12 DIAGNOSIS — M810 Age-related osteoporosis without current pathological fracture: Secondary | ICD-10-CM | POA: Diagnosis not present

## 2015-10-12 DIAGNOSIS — K449 Diaphragmatic hernia without obstruction or gangrene: Secondary | ICD-10-CM | POA: Diagnosis not present

## 2015-10-12 DIAGNOSIS — Z9181 History of falling: Secondary | ICD-10-CM | POA: Diagnosis not present

## 2015-10-12 DIAGNOSIS — S72142D Displaced intertrochanteric fracture of left femur, subsequent encounter for closed fracture with routine healing: Secondary | ICD-10-CM | POA: Diagnosis not present

## 2015-10-12 DIAGNOSIS — S72009A Fracture of unspecified part of neck of unspecified femur, initial encounter for closed fracture: Secondary | ICD-10-CM | POA: Diagnosis not present

## 2015-10-12 LAB — CBC
HEMATOCRIT: 27 % — AB (ref 36.0–46.0)
Hemoglobin: 9.1 g/dL — ABNORMAL LOW (ref 12.0–15.0)
MCH: 31 pg (ref 26.0–34.0)
MCHC: 33.7 g/dL (ref 30.0–36.0)
MCV: 91.8 fL (ref 78.0–100.0)
Platelets: 119 10*3/uL — ABNORMAL LOW (ref 150–400)
RBC: 2.94 MIL/uL — AB (ref 3.87–5.11)
RDW: 14.1 % (ref 11.5–15.5)
WBC: 5.1 10*3/uL (ref 4.0–10.5)

## 2015-10-12 LAB — BASIC METABOLIC PANEL
ANION GAP: 5 (ref 5–15)
BUN: 14 mg/dL (ref 6–20)
CALCIUM: 8.7 mg/dL — AB (ref 8.9–10.3)
CO2: 29 mmol/L (ref 22–32)
Chloride: 103 mmol/L (ref 101–111)
Creatinine, Ser: 0.93 mg/dL (ref 0.44–1.00)
Glucose, Bld: 99 mg/dL (ref 65–99)
POTASSIUM: 4.2 mmol/L (ref 3.5–5.1)
Sodium: 137 mmol/L (ref 135–145)

## 2015-10-12 MED ORDER — HYDROCODONE-ACETAMINOPHEN 10-325 MG PO TABS: 1.0000 | ORAL_TABLET | Freq: Four times a day (QID) | ORAL | Status: DC | PRN

## 2015-10-12 MED ORDER — ASPIRIN 325 MG PO TBEC: 325.0000 mg | DELAYED_RELEASE_TABLET | Freq: Every day | ORAL | Status: AC

## 2015-10-12 NOTE — Discharge Summary (Signed)
Physician Discharge Summary  Cheyenne King NWG:[id number] DOB: June 03, 1946 DOA: 10/08/2015  PCP: [name], MD  Admit date: 10/08/2015 Discharge date: 10/12/2015  Time spent: > 35 minutes  Recommendations for Outpatient Follow-up:  1. Follow up with Dr. [name] in 2 weeks 2. Follow up with Dr. [name] in 2 weeks 3. Her coreg was held on d/c due to soft blood pressure. If her BP stabilize, recommend restarting it in 1 week 4. Repeat CBC in 4 days    Discharge Diagnoses:  Principal Problem:   Femur fracture, left (HCC) Active Problems:   GERD (gastroesophageal reflux disease)   Hypothyroidism   Osteoporosis   Fibromyalgia   Essential hypertension   Hip fracture (HCC)   Low back pain   Intertrochanteric fracture of left hip [facility])  Discharge Condition: stable  Diet recommendation: heart healthy  Filed Weights   10/08/15 2136 10/09/15 2108 10/10/15 2037  Weight: 58.968 kg (130 lb) 59.1 kg (130 lb 4.7 oz) 60.555 kg (133 lb 8 oz)    History of present illness:  See H&P, Labs, Consult and Test reports for all details in brief, patient is a 69 y.o. female, street of essential hypertension, hypothyroidism, GERD, fibromyalgia, previous left hip prosthesis due to a mechanical fall, previous L-spine surgery chronic low back pain, who lives at home and is fairly mobile presented to [facility] ER after a mechanical fall she sustained at home where she landed on her left hip, subsequently developed left hip pain, found to have hip fracture.   Hospital Course:  Mechanical fall with left periprosthetic hip fracture.  - s/p ORIF - well tolerated overall. - discharge to SNF  Essential hypertension. Soft, but stable - will continue holding cozaar as patient BP is soft and she felt lightheaded when standing   Hypothyroid: continue synthroid   GERD. Continue Zantac   Leukocytosis: due to demargination most likely; no signs of infection reported or appreciated - repeat CBC with normal  white blood cells count  Acute blood loss anemia - due to surgery most likely, mild, stable, repeat CBC in 3-4 days   Thrombocytopenia - likely consumptive, stable, improving   Procedures: Open reduction and internal fixation of left intertrochanteric hip fracture. 10/09/15   Consultations:  Orthopedic surgery   Discharge Exam: Filed Vitals:   10/11/15 1705 10/11/15 2106 10/12/15 0545 10/12/15 0833  BP: 119/65 102/53 91/54 119/69  Pulse: 95 99 79 90  Temp: 97.5 F (36.4 C) 98 F (36.7 C) 97.9 F (36.6 C) 98.4 F (36.9 C)  TempSrc: Oral Oral Oral Oral  Resp: Height:      Weight:      SpO2: 100% 100% 99% 99%    General: NAD Cardiovascular: RRR Respiratory: CT biL  Discharge Instructions Activity:  As tolerated   Get Medicines reviewed and adjusted: Please take all your medications with you for your next visit with your Primary MD  Please request your Primary MD to go over all hospital tests and procedure/radiological results at the follow up, please ask your Primary MD to get all Hospital records sent to his/her office.  If you experience worsening of your admission symptoms, develop shortness of breath, life threatening emergency, suicidal or homicidal thoughts you must seek medical attention immediately by calling 911 or calling your MD immediately if symptoms less severe.  You must read complete instructions/literature along with all the possible adverse reactions/side effects for all the Medicines you take and that have been prescribed to you.  Take any new Medicines after you have completely understood and accpet all the possible adverse reactions/side effects.   Do not drive when taking Pain medications.   Do not take more than prescribed Pain, Sleep and Anxiety Medications  Special Instructions: If you have smoked or chewed Tobacco in the last 2 yrs please stop smoking, stop any regular Alcohol and or any Recreational drug use.  Wear Seat  belts while driving.  Please note  You were cared for by a hospitalist during your hospital stay. Once you are discharged, your primary care physician will handle any further medical issues. Please note that NO REFILLS for any discharge medications will be authorized once you are discharged, as it is imperative that you return to your primary care physician (or establish a relationship with a primary care physician if you do not have one) for your aftercare needs so that they can reassess your need for medications and monitor your lab values.    Medication List    STOP taking these medications        losartan 25 MG tablet  Commonly known as:  COZAAR     naproxen 500 MG tablet  Commonly known as:  NAPROSYN      TAKE these medications        aspirin 325 MG EC tablet  Take 1 tablet (325 mg total) by mouth daily with breakfast.     HYDROcodone-acetaminophen 10-325 MG tablet  Commonly known as:  NORCO  Take 1 tablet by mouth every 6 (six) hours as needed.     levothyroxine 50 MCG tablet  Commonly known as:  SYNTHROID, LEVOTHROID  Take 50 mcg by mouth daily before breakfast.     ranitidine 150 MG tablet  Commonly known as:  ZANTAC  Take 150 mg by mouth daily after supper.           Follow-up Information    Schedule an appointment as soon as possible for a visit with [name], MD.   Specialty:  Orthopedic Surgery   Why:  need return office visit 2 weeks postop   Contact information:   [address] [place] [address] [phone]       Follow up with [name], MD In 2 weeks.   Specialty:  Internal Medicine   Contact information:   [address] [place] [address] [phone]       The results of significant diagnostics from this hospitalization (including imaging, microbiology, ancillary and laboratory) are listed below for reference.    Significant Diagnostic Studies: Dg Lumbar Spine 1 View  10/28/2015  CLINICAL DATA:  Fall. Low back  pain. Prior lumbar spine surgery in 2014. EXAM: LUMBAR SPINE - 1 VIEW COMPARISON:  09/1915 AP lumbar spine radiograph. FINDINGS: This report assumes 5 non rib-bearing lumbar vertebrae. There is a moderate L1 vertebral body compression fracture of indeterminate chronicity, with approximately 50% anterior loss of vertebral body height. The L2, L3 and L5 vertebral body heights appear preserved. There is mild approximately 30% loss of height of the L4 vertebral body of indeterminate chronicity. There is 5 mm anterolisthesis at L4-5. Pedicle screws are noted overlying the L3, L4 and L5 vertebral levels, with no evidence of hardware fracture or loosening. Atherosclerotic calcifications throughout the abdominal aorta. Partially visualized hip arthroplasty hardware. Mild degenerative disc disease at T12-L1 and L5-S1. No aggressive appearing focal osseous lesions. IMPRESSION: 1. Moderate L1 and mild L4 vertebral body compression fractures of indeterminate chronicity. 2. Mild 5  mm anterolisthesis at L4-5. 3. Pedicle screws at L3 through L5, with no evidence of hardware complication. Electronically Signed   By: Delbert Phenix M.D.   On: 10/08/2015 20:40   Dg Lumbar Spine 1 View  10/08/2015  CLINICAL DATA:  Low back pain. EXAM: LUMBAR SPINE - 1 VIEW COMPARISON:  None. FINDINGS: Single AP view of the lumbar spine is provided. There is methylmethacrylate from prior vertebral body augmentation in the L4, L5 and S1 vertebral bodies with bilateral pedicle screws at each level. There are compression deformities of the L2 and L3 vertebral bodies. There is generalized osteopenia. IMPRESSION: Prior vertebral body augmentation at L4, L5 and S1 with bilateral pedicle screws at each level. Compression deformities of the L2 and L3 vertebral bodies on a single AP view. Recommend further evaluation with a lateral view of the lumbar spine. Electronically Signed   By: Elige Ko   On: 10/08/2015 19:08   Pelvis Portable  10/09/2015   CLINICAL DATA:  Repair of hip fracture EXAM: PORTABLE PELVIS 1-2 VIEWS COMPARISON:  10/08/2015 FINDINGS: Left hip hemiarthroplasty. Superimposed fracture proximal femur has been fixed with a lateral plate and cerclage wires. No dislocation. No other fracture Pedicle screws bilaterally at L3, L4, L5 without connecting rods. IMPRESSION: Left hip hemiarthroplasty. Proximal femur fracture has been fixed with a lateral plate and cerclage wires. Electronically Signed   By: Marlan Palau M.D.   On: 10/09/2015 13:08   Dg Pelvis Portable  10/08/2015  CLINICAL DATA:  Low back pain EXAM: PORTABLE PELVIS 1-2 VIEWS COMPARISON:  None. FINDINGS: There is generalized osteopenia. There is a left hip arthroplasty. There is no acute fracture or dislocation. 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 There are degenerative changes of bilateral SI joints. There is prior augmentation of the L4, L5 and S1 vertebral bodies. IMPRESSION: No acute osseous injury of the pelvis. Electronically Signed   By: Elige Ko   On: 10/08/2015 19:08   Dg Chest Port 1 View  10/08/2015  CLINICAL DATA:  Preoperative evaluation.  Left hip fracture. EXAM: PORTABLE CHEST 1 VIEW COMPARISON:  None. FINDINGS: Patient is rotated to the left, limiting evaluation. Normal cardiac and mediastinal contours given patient rotation. No large area of pulmonary consolidation. No pleural effusion or pneumothorax. IMPRESSION: No acute cardiopulmonary process. Electronically Signed   By: Annia Belt M.D.   On: 10/08/2015 19:07   Dg Hip Operative Unilat With Pelvis Left  10/09/2015  CLINICAL DATA:  Left intertrochanteric periprosthetic hip arthroplasty. EXAM: OPERATIVE LEFT HIP (WITH PELVIS IF PERFORMED)  VIEWS TECHNIQUE: Fluoroscopic spot image(s) were submitted for interpretation post-operatively. COMPARISON:  Radiograph dated 10/08/2015. FINDINGS: Intraoperative fluoroscopic images demonstrate placement of side plate and cerclage wire fixation around left  proximal femur pre-existing long stem component of total hip arthroplasty. The alignment is anatomic. Fluoroscopy time is recorded as 12.6 seconds. IMPRESSION: Intraoperative images from left intertrochanteric periprostatic hip arthroplasty. No evidence of immediate complications. Electronically Signed   By: Ted Mcalpine M.D.   On: 10/09/2015 11:03   Labs: Basic Metabolic Panel:  Recent Labs Lab 10/08/15 1819 10/10/15 0621 10/11/15 0414 10/12/15 0531  NA 141 136 136 137  K 4.9 4.2 4.2 4.2  CL 106 104 101 103  CO2 29 26 30 29   GLUCOSE 110* 74 102* 99  BUN 9 11 9 14   CREATININE 0.97 0.98 0.91 0.93  CALCIUM 9.4 8.2* 8.7* 8.7*  MG 2.1  --   --   --    CBC:  Recent Labs Lab 10/08/15 1819 10/10/15 0621 10/11/15 0414 10/12/15 0531  WBC 10.8* 5.1 5.9 5.1  HGB 13.1 9.8* 9.6* 9.1*  HCT 39.7 30.4* 28.9* 27.0*  MCV 92.3 93.5 92.9 91.8  PLT 151 111* 114* 119*    Signed:  GHERGHE, COSTIN  [facility] 10/12/2015, 11:59 AM

## 2015-10-12 NOTE — Clinical Social Work Placement (Addendum)
   CLINICAL SOCIAL WORK PLACEMENT  NOTE DISCHARGED TO [facility] - 10/12/15  Date:  10/12/2015  Patient Details  Name: Cheyenne King MRN: [id number] Date of Birth: 09/12/1946  Clinical Social Work is seeking post-discharge placement for this patient at the Skilled  Nursing Facility level of care (*CSW will initial, date and re-position this form in  chart as items are completed):  No (Patient was aware of the facilities she wanted.)   Patient/family provided with [facility] Clinical Social Work Department's list of facilities offering this level of care within the geographic area requested by the patient (or if unable, by the patient's family).  Yes   Patient/family informed of their freedom to choose among providers that offer the needed level of care, that participate in Medicare, Medicaid or managed care program needed by the patient, have an available bed and are willing to accept the patient.  Yes   Patient/family informed of [facility]'s ownership interest in [facility] and [facility], as well as of the fact that they are under no obligation to receive care at these facilities.  PASRR submitted to EDS on 10/12/15     PASRR number received on  10/12/15  - [id number] E.  Effective 11/23 - 11/11/15.    Existing PASRR number confirmed on       FL2 transmitted to all facilities in geographic area requested by pt/family on 10/12/15     FL2 transmitted to all facilities within larger geographic area on       Patient informed that his/her managed care company has contracts with or will negotiate with certain facilities, including the following:         10/12/15 - Patient/family informed of bed offers received.  Patient [facility] bed at  [facility]     Physician recommends and patient chooses bed at      [facility]  10[facility]12[facility]15.  Patient to be transferred to facility by  ambulance     Patient family notified on  10/12/15 of  transfer.  Name of family member notified:   Sister, Cheyenne King, at the bedside.    PHYSICIAN       Additional Comment:    _______________________________________________ Cristobal Goldmannrawford, Jonah Gingras Bradley, LCSW 10/12/2015, 12:15 PM

## 2015-10-12 NOTE — Clinical Social Work Note (Signed)
Clinical Social Work Assessment  Patient Details  Name: Cheyenne SchmidJoann Tuohy MRN: [id number] Date of Birth: 11/22/1945  Date of referral:  10/12/15               Reason for consult:  Facility Placement                Permission sought to share information with:  Family Supports Permission granted to share information::  Yes, Verbal Permission Granted  Name::     Rene KocherRegina 'Jeronimo NormaJeanie' Wiliam Keewell   Agency::     Relationship::  Twin sister  Contact Information:  [phone]  Housing/Transportation Living arrangements for the past 2 months:  Single Family Home Source of Information:  Patient, Other (Comment Required) (Sister) Patient Interpreter Needed:  None Criminal Activity/Legal Involvement Pertinent to Current Situation/Hospitalization:  No - Comment as needed Significant Relationships:  Adult Children, Siblings Lives with:  Self Do you feel safe going back to the place where you live?  Yes (Patient feels safe at home but undertands she needs rehab to be physically stronger before going home) Need for family participation in patient care:  Yes (Comment)  Care giving concerns:  None expressed by patient or sister, Beverely LowJeannie. The plan is for patient to d/c to ST rehab and then her twin sister will stay with her temporarily until patient feels comfortable being alone at home.   Social Worker assessment / plan:  CSW talked with patient and her twin sister Beverely LowJeannie regarding discharge planning and recommendation of ST rehab.  Patient was expecting to talk with CSW as inpatient rehab declined her and recommended ST rehab in a skilled facility. Ms. Netty Starringamako was very pleasant and happy to talk with CSW. She [place] a facility in [place], [place], as this is where her primary care MD and other physicians are located.  Her preferences are [facility] and [facility] [place].   Employment status:  Retired Health and safety inspectornsurance information:  Medicare PT Recommendations:  Skilled Nursing Facility Information / Referral to community  resources:  Other (Comment Required) (Facility list not needed by patient patient already had SNF preferences)  Patient/Family's Response to care:  No concerns expressed by patient/family.  Patient/Family's Understanding of and Emotional Response to Diagnosis, Current Treatment, and Prognosis:  Not discussed.  Emotional Assessment Appearance:  Appears stated age Attitude/Demeanor/Rapport:  Other (Appropriate) Affect (typically observed):  Accepting, Pleasant, Appropriate Orientation:  Oriented to Self, Oriented to Place, Oriented to  Time, Oriented to Situation Alcohol / Substance use:  Tobacco Use (Patient quit smoking several years ago, and reports that she does not drink or use illicit drugs.) Psych involvement (Current and /or in the community):  No (Comment)  Discharge Needs  Concerns to be addressed:  Discharge Planning Concerns Readmission within the last 30 days:  No Current discharge risk:  None Barriers to Discharge:  No Barriers Identified   Cristobal GoldmannCrawford, Kedrick Mcnamee Bradley, LCSW 10/12/2015, 12:04 PM

## 2015-10-12 NOTE — Progress Notes (Signed)
Physical Therapy Treatment Patient Details Name: Cheyenne King MRN: [id number] DOB: 08/17/1946 Today's Date: 10/12/2015    History of Present Illness Cheyenne King is a 69 y.o. female, street of essential hypertension, hypothyroidism, GERD, fibromyalgia, previous left hip prosthesis due to a mechanical fall, previous L-spine surgery chronic low back pain, who lives at home and is fairly mobile [facility] after a mechanical fall she sustained at home where she landed on her left hip,. Pt s/p L ORIF to hip 11/20, pt WBAT L LE.    PT Comments    Pt demonstrated improved capacity to ambulate and perform exercises, reporting no dizziness during Tx. Pt continues to need assistance going from commode to standing. Steps were not attempted today due to fatigue. She would continue to benefit for PT to increase strength, endurance, and safety during transfers to decrease burden of care.   Follow Up Recommendations        Equipment Recommendations       Recommendations for Other Services       Precautions / Restrictions Restrictions Weight Bearing Restrictions: No LLE Weight Bearing: Weight bearing as tolerated    Mobility  Bed Mobility Overal bed mobility: Modified Independent Bed Mobility: Supine to Sit     Supine to sit: Modified independent (Device/Increase time)     General bed mobility comments: HOB elevated 30 degrees, pt used handrails to pivot  Transfers Overall transfer level: Needs assistance   Transfers: Sit to/from Stand Sit to Stand: Min assist         General transfer comment: increased time required, min assist for stability, VCs for hand placement   Ambulation/Gait Ambulation/Gait assistance: Min guard Ambulation Distance (Feet): 45 Feet Assistive device: Rolling walker (2 wheeled) Gait Pattern/deviations: Step-to pattern;Decreased stance time - left;Decreased stride length;Decreased step length - left Gait velocity: decreased   General Gait  Details: Min guard for stability, and VC for sequencing during turns and proper gait pattern   Stairs            Wheelchair Mobility    Modified Rankin (Stroke Patients Only)       Balance Overall balance assessment: Needs assistance Sitting-balance support: Bilateral upper extremity supported;Feet supported Sitting balance-Leahy Scale: Fair                              Cognition Arousal/Alertness: Awake/alert Behavior During Therapy: WFL for tasks assessed/performed Overall Cognitive Status: Within Functional Limits for tasks assessed                      Exercises General Exercises - Lower Extremity Ankle Circles/Pumps: AROM;Both;10 reps;Seated Quad Sets: AROM;Left;10 reps;Seated Gluteal Sets: AROM;Seated;Both;10 reps Long Arc Quad: AROM;Left;10 reps;Seated    General Comments        Pertinent Vitals/Pain Pain Assessment: 0-10 Pain Score: 3  Pain Location: L Hip  Pain Descriptors / Indicators: Aching Pain Intervention(s): Monitored during session;Repositioned    Home Living                      Prior Function            PT Goals (current goals can now be found in the care plan section) Progress towards PT goals: Progressing toward goals    Frequency       PT Plan      Co-evaluation             End  of Session Equipment Utilized During Treatment: Gait belt Activity Tolerance: Patient tolerated treatment well Patient left: in chair;with call bell/phone within reach;with family/visitor present     Time:  -     Charges:                       G CodesJimmy Picket 10/16/2015, 11:25 AM Jimmy Picket, SPT 2015-10-16 11:30 AM

## 2015-10-12 NOTE — Progress Notes (Signed)
Cheyenne SchmidJoann King to be D/C'd Skilled nursing facility per MD order.  Discussed prescriptions and follow up appointments with the patient. Prescriptions given to patient, medication list explained in detail. Pt verbalized understanding.    Medication List    STOP taking these medications        losartan 25 MG tablet  Commonly known as:  COZAAR     naproxen 500 MG tablet  Commonly known as:  NAPROSYN      TAKE these medications        aspirin 325 MG EC tablet  Take 1 tablet (325 mg total) by mouth daily with breakfast.     HYDROcodone-acetaminophen 10-325 MG tablet  Commonly known as:  NORCO  Take 1 tablet by mouth every 6 (six) hours as needed.     levothyroxine 50 MCG tablet  Commonly known as:  SYNTHROID, LEVOTHROID  Take 50 mcg by mouth daily before breakfast.     ranitidine 150 MG tablet  Commonly known as:  ZANTAC  Take 150 mg by mouth daily after supper.        Filed Vitals:   10/12/15 0545 10/12/15 0833  BP: 91/54 119/69  Pulse: 79 90  Temp: 97.9 F (36.6 C) 98.4 F (36.9 C)  Resp: 17 18    Skin clean, dry and intact without evidence of skin break down, no evidence of skin tears noted. IV catheter discontinued intact. Site without signs and symptoms of complications. Dressing and pressure applied. Pt denies pain at this time. No complaints noted.  Janeann ForehandLuke Kianna Billet BSN, RN

## 2015-10-12 NOTE — [place] FL2 (Signed)
[place] MEDICAID FL2 LEVEL OF CARE SCREENING TOOL     IDENTIFICATION  Patient Name: Cheyenne SchmidJoann Bauza Birthdate: 11/13/1946 Sex: female Admission Date (Current Location): 10/08/2015  [place] [facility] [place]   Facility and Address:  [facility] [place], [address], [place], [place] [address]      Provider Number: [id number]  Attending Physician Name and Address:  Leatha Gildingostin M Gherghe, MD  Relative Name and Phone Number:  Trellis PaganiniRegina Newell, sister - [phone]      Current Level of Care: Hospital Recommended Level of Care: Nursing Facility Prior Approval Number:    Date Approved/Denied:   PASRR Number:    Discharge Plan: SNF    Current Diagnoses: Patient Active Problem List   Diagnosis Date Noted  . Intertrochanteric fracture of left hip (HCC) 10/09/2015  . Hip fracture (HCC) 10/08/2015  . Femur fracture, left (HCC) 10/08/2015  . GERD (gastroesophageal reflux disease)   . Hypothyroidism   . Osteoporosis   . Fibromyalgia   . Essential hypertension   . Low back pain     Orientation ACTIVITIES/SOCIAL BLADDER RESPIRATION    Self, Time, Situation, Place  Active Continent Normal  BEHAVIORAL SYMPTOMS/MOOD NEUROLOGICAL BOWEL NUTRITION STATUS      Continent Diet (Regular diet)  PHYSICIAN VISITS COMMUNICATION OF NEEDS Height & Weight Skin    Verbally   133 lbs. Normal, Other (Comment) (Closed incision left leg)          AMBULATORY STATUS RESPIRATION     (Per PT, patient is minimum assist with ambulation) Normal      Personal Care Assistance Level of Assistance  Bathing, Feeding, Dressing Bathing Assistance: Limited assistance Feeding assistance: Independent Dressing Assistance: Limited assistance      Functional Limitations Info                SPECIAL CARE FACTORS FREQUENCY  PT (By licensed PT), OT (By licensed OT)     PT Frequency: PT evaluated 11/21. Recommend therapy 5x per week OT Frequency: OT evaluated  11/21. Recommends therapy 2x per week           Additional Factors Info  Code Status, Allergies Code Status Info: Full Code Allergies Info: Gabapentin, Neosporin Original, Penicillins, Senokot Wheat Bran              Current Medications (10/12/2015): Current Facility-Administered Medications  Medication Dose Route Frequency Provider Last Rate Last Dose  . 0.45 % sodium chloride infusion   Intravenous Continuous Eldred MangesMark C Yates, MD   Stopped at 10/10/15 1038  . acetaminophen (TYLENOL) tablet 650 mg  650 mg Oral 3 times per day Vassie Lollarlos Madera, MD   650 mg at 10/12/15 0600  . albuterol (PROVENTIL) (2.5 MG/3ML) 0.083% nebulizer solution 2.5 mg  2.5 mg Nebulization Q4H PRN Leroy SeaPrashant K Singh, MD      . aspirin EC tablet 325 mg  325 mg Oral Q breakfast Eldred MangesMark C Yates, MD   325 mg at 10/12/15 0813  . bisacodyl (DULCOLAX) suppository 10 mg  10 mg Rectal Daily PRN Eldred MangesMark C Yates, MD      . docusate sodium (COLACE) capsule 100 mg  100 mg Oral BID Eldred MangesMark C Yates, MD   100 mg at 10/12/15 [id number]  . famotidine (PEPCID) tablet 10 mg  10 mg Oral Daily Leroy SeaPrashant K Singh, MD   10 mg at 10/12/15 0939  . heparin injection 5,000 Units  5,000 Units Subcutaneous 3 times per day Leroy SeaPrashant K Singh, MD  5,000 Units at 10/12/15 0600  . iron polysaccharides (NIFEREX) capsule 150 mg  150 mg Oral BID Vassie Loll, MD   150 mg at 10/12/15 0939  . ketorolac (TORADOL) 30 MG/ML injection 30 mg  30 mg Intravenous Q8H PRN Vassie Loll, MD   30 mg at 10/11/15 1801  . levothyroxine (SYNTHROID, LEVOTHROID) tablet 50 mcg  50 mcg Oral QAC breakfast Leroy Sea, MD   50 mcg at 10/12/15 0814  . menthol-cetylpyridinium (CEPACOL) lozenge 3 mg  1 lozenge Oral PRN [name], MD       Or  . phenol (CHLORASEPTIC) mouth spray 1 spray  1 spray Mouth/Throat PRN [name], MD      . metoCLOPramide (REGLAN) tablet 5-10 mg  5-10 mg Oral Q8H PRN [name], MD       Or  . metoCLOPramide (REGLAN) injection 5-10 mg  5-10 mg Intravenous  Q8H PRN [name], MD      . ondansetron [facility]) tablet 4 mg  4 mg Oral Q6H PRN [name], MD       Or  . ondansetron [facility]) injection 4 mg  4 mg Intravenous Q6H PRN [name], MD      . polyethylene glycol (MIRALAX / GLYCOLAX) packet 17 g  17 g Oral BID Vassie Loll, MD   17 g at 10/12/15 8469   Do not use this list as official medication orders. Please verify with discharge summary.  Discharge Medications:   Medication List    STOP taking these medications        naproxen 500 MG tablet  Commonly known as:  NAPROSYN      TAKE these medications        aspirin 325 MG EC tablet  Take 1 tablet (325 mg total) by mouth daily with breakfast.     HYDROcodone-acetaminophen 10-325 MG tablet  Commonly known as:  NORCO  Take 1 tablet by mouth every 6 (six) hours as needed.      ASK your doctor about these medications        levothyroxine 50 MCG tablet  Commonly known as:  SYNTHROID, LEVOTHROID  Take 50 mcg by mouth daily before breakfast.     losartan 25 MG tablet  Commonly known as:  COZAAR  Take 25 mg by mouth daily.     ranitidine 150 MG tablet  Commonly known as:  ZANTAC  Take 150 mg by mouth daily after supper.        Relevant Imaging Results:  Relevant Lab Results:  Recent Labs    Additional Information Procedure: Open reduction and internal fixation of left intertrochanteric hip fracture. 10/09/15.  Cristobal Goldmann, LCSW

## 2015-10-12 NOTE — Progress Notes (Addendum)
Subjective: Patient seen by Dr Cheyenne King this morning. Doing well.  Pain controlled.    Objective: Vital signs in last 24 hours: Temp:  [97.5 F (36.4 C)-98.4 F (36.9 C)] 98.4 F (36.9 C) (11/23 0833) Pulse Rate:  [79-99] 90 (11/23 0833) Resp:  [17-18] 18 (11/23 0833) BP: (91-119)/(53-69) 119/69 mmHg (11/23 0833) SpO2:  [99 %-100 %] 99 % (11/23 0833)  Intake/Output from previous day: 11/22 0701 - 11/23 0700 In: 600 [P.O.:600] Out: 400 [Urine:400] Intake/Output this shift: Total I/O In: 240 [P.O.:240] Out: 0    Recent Labs  10/10/15 0621 10/11/15 0414 10/12/15 0531  HGB 9.8* 9.6* 9.1*    Recent Labs  10/11/15 0414 10/12/15 0531  WBC 5.9 5.1  RBC 3.11* 2.94*  HCT 28.9* 27.0*  PLT 114* 119*    Recent Labs  10/11/15 0414 10/12/15 0531  NA 136 137  K 4.2 4.2  CL 101 103  CO2 30 29  BUN 9 14  CREATININE 0.91 0.93  GLUCOSE 102* 99  CALCIUM 8.7* 8.7*   No results for input(s): LABPT, INR in the last 72 hours.  Exam:  Alert and oriented.  NVI. Hip no signs of infection.   Assessment/Plan: Progressing well.  Reviewed Dr Cheyenne King's recommendation with SNF and I agree, but patient had also refused when I previously discussed with her.  States that she will have assistance from elderly friend/family member.  Home when medically stable. Will use aspirin 325mg  daily for postop dvt prophylaxis and norco script given for pain.     Cheyenne King M 10/12/2015, 9:08 AM

## 2015-10-12 NOTE — Discharge Instructions (Signed)
ORTHOPEDIC DISCHARGE INSTRUCTIONS FOR ORIF LEFT HIP -ok to shower but no tub soaking. -do not apply any creams or ointments to incision -daily dressing changes with 4x4 gauze and tape -no driving, lifting, squatting -weightbear as tolerated with walker.

## 2015-10-12 NOTE — Progress Notes (Signed)
Rehab admissions - Please see note from Dr. Riley KillSwartz yesterday.  Patient does not meet medical necessity criteria for acute inpatient rehab admission.  Recommend SNF or HH therapies for follow up.  #[phone]

## 2015-10-27 DIAGNOSIS — S72142D Displaced intertrochanteric fracture of left femur, subsequent encounter for closed fracture with routine healing: Secondary | ICD-10-CM | POA: Diagnosis not present

## 2015-11-05 DIAGNOSIS — I10 Essential (primary) hypertension: Secondary | ICD-10-CM | POA: Diagnosis not present

## 2015-11-05 DIAGNOSIS — M6281 Muscle weakness (generalized): Secondary | ICD-10-CM | POA: Diagnosis not present

## 2015-11-05 DIAGNOSIS — M5136 Other intervertebral disc degeneration, lumbar region: Secondary | ICD-10-CM | POA: Diagnosis not present

## 2015-11-05 DIAGNOSIS — M797 Fibromyalgia: Secondary | ICD-10-CM | POA: Diagnosis not present

## 2015-11-05 DIAGNOSIS — M810 Age-related osteoporosis without current pathological fracture: Secondary | ICD-10-CM | POA: Diagnosis not present

## 2015-11-05 DIAGNOSIS — S72142D Displaced intertrochanteric fracture of left femur, subsequent encounter for closed fracture with routine healing: Secondary | ICD-10-CM | POA: Diagnosis not present

## 2015-11-05 DIAGNOSIS — K219 Gastro-esophageal reflux disease without esophagitis: Secondary | ICD-10-CM | POA: Diagnosis not present

## 2015-11-07 DIAGNOSIS — I10 Essential (primary) hypertension: Secondary | ICD-10-CM | POA: Diagnosis not present

## 2015-11-07 DIAGNOSIS — S72142D Displaced intertrochanteric fracture of left femur, subsequent encounter for closed fracture with routine healing: Secondary | ICD-10-CM | POA: Diagnosis not present

## 2015-11-07 DIAGNOSIS — M5136 Other intervertebral disc degeneration, lumbar region: Secondary | ICD-10-CM | POA: Diagnosis not present

## 2015-11-07 DIAGNOSIS — M6281 Muscle weakness (generalized): Secondary | ICD-10-CM | POA: Diagnosis not present

## 2015-11-07 DIAGNOSIS — M797 Fibromyalgia: Secondary | ICD-10-CM | POA: Diagnosis not present

## 2015-11-07 DIAGNOSIS — M810 Age-related osteoporosis without current pathological fracture: Secondary | ICD-10-CM | POA: Diagnosis not present

## 2015-11-09 DIAGNOSIS — M545 Low back pain: Secondary | ICD-10-CM | POA: Diagnosis not present

## 2015-11-10 DIAGNOSIS — M5136 Other intervertebral disc degeneration, lumbar region: Secondary | ICD-10-CM | POA: Diagnosis not present

## 2015-11-10 DIAGNOSIS — I10 Essential (primary) hypertension: Secondary | ICD-10-CM | POA: Diagnosis not present

## 2015-11-10 DIAGNOSIS — S72142D Displaced intertrochanteric fracture of left femur, subsequent encounter for closed fracture with routine healing: Secondary | ICD-10-CM | POA: Diagnosis not present

## 2015-11-10 DIAGNOSIS — M6281 Muscle weakness (generalized): Secondary | ICD-10-CM | POA: Diagnosis not present

## 2015-11-10 DIAGNOSIS — M797 Fibromyalgia: Secondary | ICD-10-CM | POA: Diagnosis not present

## 2015-11-10 DIAGNOSIS — M810 Age-related osteoporosis without current pathological fracture: Secondary | ICD-10-CM | POA: Diagnosis not present

## 2015-11-11 DIAGNOSIS — I10 Essential (primary) hypertension: Secondary | ICD-10-CM | POA: Diagnosis not present

## 2015-11-11 DIAGNOSIS — S72142D Displaced intertrochanteric fracture of left femur, subsequent encounter for closed fracture with routine healing: Secondary | ICD-10-CM | POA: Diagnosis not present

## 2015-11-11 DIAGNOSIS — M797 Fibromyalgia: Secondary | ICD-10-CM | POA: Diagnosis not present

## 2015-11-11 DIAGNOSIS — M5136 Other intervertebral disc degeneration, lumbar region: Secondary | ICD-10-CM | POA: Diagnosis not present

## 2015-11-11 DIAGNOSIS — M6281 Muscle weakness (generalized): Secondary | ICD-10-CM | POA: Diagnosis not present

## 2015-11-11 DIAGNOSIS — M810 Age-related osteoporosis without current pathological fracture: Secondary | ICD-10-CM | POA: Diagnosis not present

## 2015-11-15 DIAGNOSIS — M797 Fibromyalgia: Secondary | ICD-10-CM | POA: Diagnosis not present

## 2015-11-15 DIAGNOSIS — S72142D Displaced intertrochanteric fracture of left femur, subsequent encounter for closed fracture with routine healing: Secondary | ICD-10-CM | POA: Diagnosis not present

## 2015-11-15 DIAGNOSIS — I10 Essential (primary) hypertension: Secondary | ICD-10-CM | POA: Diagnosis not present

## 2015-11-15 DIAGNOSIS — M810 Age-related osteoporosis without current pathological fracture: Secondary | ICD-10-CM | POA: Diagnosis not present

## 2015-11-15 DIAGNOSIS — M6281 Muscle weakness (generalized): Secondary | ICD-10-CM | POA: Diagnosis not present

## 2015-11-15 DIAGNOSIS — M5136 Other intervertebral disc degeneration, lumbar region: Secondary | ICD-10-CM | POA: Diagnosis not present

## 2015-11-17 DIAGNOSIS — I10 Essential (primary) hypertension: Secondary | ICD-10-CM | POA: Diagnosis not present

## 2015-11-17 DIAGNOSIS — M797 Fibromyalgia: Secondary | ICD-10-CM | POA: Diagnosis not present

## 2015-11-17 DIAGNOSIS — M5136 Other intervertebral disc degeneration, lumbar region: Secondary | ICD-10-CM | POA: Diagnosis not present

## 2015-11-17 DIAGNOSIS — M6281 Muscle weakness (generalized): Secondary | ICD-10-CM | POA: Diagnosis not present

## 2015-11-17 DIAGNOSIS — M810 Age-related osteoporosis without current pathological fracture: Secondary | ICD-10-CM | POA: Diagnosis not present

## 2015-11-17 DIAGNOSIS — S72142D Displaced intertrochanteric fracture of left femur, subsequent encounter for closed fracture with routine healing: Secondary | ICD-10-CM | POA: Diagnosis not present

## 2015-11-18 DIAGNOSIS — I10 Essential (primary) hypertension: Secondary | ICD-10-CM | POA: Diagnosis not present

## 2015-11-18 DIAGNOSIS — M810 Age-related osteoporosis without current pathological fracture: Secondary | ICD-10-CM | POA: Diagnosis not present

## 2015-11-18 DIAGNOSIS — S72142D Displaced intertrochanteric fracture of left femur, subsequent encounter for closed fracture with routine healing: Secondary | ICD-10-CM | POA: Diagnosis not present

## 2015-11-18 DIAGNOSIS — M5136 Other intervertebral disc degeneration, lumbar region: Secondary | ICD-10-CM | POA: Diagnosis not present

## 2015-11-18 DIAGNOSIS — M797 Fibromyalgia: Secondary | ICD-10-CM | POA: Diagnosis not present

## 2015-11-18 DIAGNOSIS — M6281 Muscle weakness (generalized): Secondary | ICD-10-CM | POA: Diagnosis not present

## 2015-11-22 DIAGNOSIS — M810 Age-related osteoporosis without current pathological fracture: Secondary | ICD-10-CM | POA: Diagnosis not present

## 2015-11-22 DIAGNOSIS — I10 Essential (primary) hypertension: Secondary | ICD-10-CM | POA: Diagnosis not present

## 2015-11-22 DIAGNOSIS — M6281 Muscle weakness (generalized): Secondary | ICD-10-CM | POA: Diagnosis not present

## 2015-11-22 DIAGNOSIS — S72142D Displaced intertrochanteric fracture of left femur, subsequent encounter for closed fracture with routine healing: Secondary | ICD-10-CM | POA: Diagnosis not present

## 2015-11-22 DIAGNOSIS — M797 Fibromyalgia: Secondary | ICD-10-CM | POA: Diagnosis not present

## 2015-11-22 DIAGNOSIS — M5136 Other intervertebral disc degeneration, lumbar region: Secondary | ICD-10-CM | POA: Diagnosis not present

## 2015-11-23 DIAGNOSIS — M810 Age-related osteoporosis without current pathological fracture: Secondary | ICD-10-CM | POA: Diagnosis not present

## 2015-11-23 DIAGNOSIS — M5136 Other intervertebral disc degeneration, lumbar region: Secondary | ICD-10-CM | POA: Diagnosis not present

## 2015-11-23 DIAGNOSIS — M797 Fibromyalgia: Secondary | ICD-10-CM | POA: Diagnosis not present

## 2015-11-23 DIAGNOSIS — M6281 Muscle weakness (generalized): Secondary | ICD-10-CM | POA: Diagnosis not present

## 2015-11-23 DIAGNOSIS — I10 Essential (primary) hypertension: Secondary | ICD-10-CM | POA: Diagnosis not present

## 2015-11-23 DIAGNOSIS — S72142D Displaced intertrochanteric fracture of left femur, subsequent encounter for closed fracture with routine healing: Secondary | ICD-10-CM | POA: Diagnosis not present

## 2015-11-25 DIAGNOSIS — M810 Age-related osteoporosis without current pathological fracture: Secondary | ICD-10-CM | POA: Diagnosis not present

## 2015-11-25 DIAGNOSIS — M797 Fibromyalgia: Secondary | ICD-10-CM | POA: Diagnosis not present

## 2015-11-25 DIAGNOSIS — M6281 Muscle weakness (generalized): Secondary | ICD-10-CM | POA: Diagnosis not present

## 2015-11-25 DIAGNOSIS — I10 Essential (primary) hypertension: Secondary | ICD-10-CM | POA: Diagnosis not present

## 2015-11-25 DIAGNOSIS — S72142D Displaced intertrochanteric fracture of left femur, subsequent encounter for closed fracture with routine healing: Secondary | ICD-10-CM | POA: Diagnosis not present

## 2015-11-25 DIAGNOSIS — M5136 Other intervertebral disc degeneration, lumbar region: Secondary | ICD-10-CM | POA: Diagnosis not present

## 2015-11-28 DIAGNOSIS — I10 Essential (primary) hypertension: Secondary | ICD-10-CM | POA: Diagnosis not present

## 2015-11-28 DIAGNOSIS — M5136 Other intervertebral disc degeneration, lumbar region: Secondary | ICD-10-CM | POA: Diagnosis not present

## 2015-11-28 DIAGNOSIS — M797 Fibromyalgia: Secondary | ICD-10-CM | POA: Diagnosis not present

## 2015-11-28 DIAGNOSIS — M6281 Muscle weakness (generalized): Secondary | ICD-10-CM | POA: Diagnosis not present

## 2015-11-28 DIAGNOSIS — S72142D Displaced intertrochanteric fracture of left femur, subsequent encounter for closed fracture with routine healing: Secondary | ICD-10-CM | POA: Diagnosis not present

## 2015-11-28 DIAGNOSIS — M810 Age-related osteoporosis without current pathological fracture: Secondary | ICD-10-CM | POA: Diagnosis not present

## 2015-11-30 DIAGNOSIS — I10 Essential (primary) hypertension: Secondary | ICD-10-CM | POA: Diagnosis not present

## 2015-11-30 DIAGNOSIS — M797 Fibromyalgia: Secondary | ICD-10-CM | POA: Diagnosis not present

## 2015-11-30 DIAGNOSIS — M5136 Other intervertebral disc degeneration, lumbar region: Secondary | ICD-10-CM | POA: Diagnosis not present

## 2015-11-30 DIAGNOSIS — S72142D Displaced intertrochanteric fracture of left femur, subsequent encounter for closed fracture with routine healing: Secondary | ICD-10-CM | POA: Diagnosis not present

## 2015-11-30 DIAGNOSIS — M810 Age-related osteoporosis without current pathological fracture: Secondary | ICD-10-CM | POA: Diagnosis not present

## 2015-11-30 DIAGNOSIS — M6281 Muscle weakness (generalized): Secondary | ICD-10-CM | POA: Diagnosis not present

## 2015-12-01 DIAGNOSIS — M5416 Radiculopathy, lumbar region: Secondary | ICD-10-CM | POA: Diagnosis not present

## 2015-12-02 DIAGNOSIS — M5416 Radiculopathy, lumbar region: Secondary | ICD-10-CM | POA: Diagnosis not present

## 2015-12-05 DIAGNOSIS — M47816 Spondylosis without myelopathy or radiculopathy, lumbar region: Secondary | ICD-10-CM | POA: Diagnosis not present

## 2015-12-06 DIAGNOSIS — M6281 Muscle weakness (generalized): Secondary | ICD-10-CM | POA: Diagnosis not present

## 2015-12-06 DIAGNOSIS — S72142D Displaced intertrochanteric fracture of left femur, subsequent encounter for closed fracture with routine healing: Secondary | ICD-10-CM | POA: Diagnosis not present

## 2015-12-06 DIAGNOSIS — M810 Age-related osteoporosis without current pathological fracture: Secondary | ICD-10-CM | POA: Diagnosis not present

## 2015-12-06 DIAGNOSIS — I10 Essential (primary) hypertension: Secondary | ICD-10-CM | POA: Diagnosis not present

## 2015-12-06 DIAGNOSIS — M5136 Other intervertebral disc degeneration, lumbar region: Secondary | ICD-10-CM | POA: Diagnosis not present

## 2015-12-06 DIAGNOSIS — M797 Fibromyalgia: Secondary | ICD-10-CM | POA: Diagnosis not present

## 2015-12-08 DIAGNOSIS — M47816 Spondylosis without myelopathy or radiculopathy, lumbar region: Secondary | ICD-10-CM | POA: Diagnosis not present

## 2015-12-08 DIAGNOSIS — M1288 Other specific arthropathies, not elsewhere classified, other specified site: Secondary | ICD-10-CM | POA: Diagnosis not present

## 2015-12-12 DIAGNOSIS — M5136 Other intervertebral disc degeneration, lumbar region: Secondary | ICD-10-CM | POA: Diagnosis not present

## 2015-12-12 DIAGNOSIS — I10 Essential (primary) hypertension: Secondary | ICD-10-CM | POA: Diagnosis not present

## 2015-12-12 DIAGNOSIS — S72142D Displaced intertrochanteric fracture of left femur, subsequent encounter for closed fracture with routine healing: Secondary | ICD-10-CM | POA: Diagnosis not present

## 2015-12-12 DIAGNOSIS — M797 Fibromyalgia: Secondary | ICD-10-CM | POA: Diagnosis not present

## 2015-12-12 DIAGNOSIS — M6281 Muscle weakness (generalized): Secondary | ICD-10-CM | POA: Diagnosis not present

## 2015-12-12 DIAGNOSIS — M810 Age-related osteoporosis without current pathological fracture: Secondary | ICD-10-CM | POA: Diagnosis not present

## 2015-12-14 DIAGNOSIS — I10 Essential (primary) hypertension: Secondary | ICD-10-CM | POA: Diagnosis not present

## 2015-12-14 DIAGNOSIS — M810 Age-related osteoporosis without current pathological fracture: Secondary | ICD-10-CM | POA: Diagnosis not present

## 2015-12-14 DIAGNOSIS — M6281 Muscle weakness (generalized): Secondary | ICD-10-CM | POA: Diagnosis not present

## 2015-12-14 DIAGNOSIS — S72142D Displaced intertrochanteric fracture of left femur, subsequent encounter for closed fracture with routine healing: Secondary | ICD-10-CM | POA: Diagnosis not present

## 2015-12-14 DIAGNOSIS — M5136 Other intervertebral disc degeneration, lumbar region: Secondary | ICD-10-CM | POA: Diagnosis not present

## 2015-12-14 DIAGNOSIS — M797 Fibromyalgia: Secondary | ICD-10-CM | POA: Diagnosis not present

## 2015-12-19 DIAGNOSIS — M810 Age-related osteoporosis without current pathological fracture: Secondary | ICD-10-CM | POA: Diagnosis not present

## 2015-12-19 DIAGNOSIS — M5136 Other intervertebral disc degeneration, lumbar region: Secondary | ICD-10-CM | POA: Diagnosis not present

## 2015-12-19 DIAGNOSIS — S72142D Displaced intertrochanteric fracture of left femur, subsequent encounter for closed fracture with routine healing: Secondary | ICD-10-CM | POA: Diagnosis not present

## 2015-12-19 DIAGNOSIS — M797 Fibromyalgia: Secondary | ICD-10-CM | POA: Diagnosis not present

## 2015-12-19 DIAGNOSIS — M6281 Muscle weakness (generalized): Secondary | ICD-10-CM | POA: Diagnosis not present

## 2015-12-19 DIAGNOSIS — I10 Essential (primary) hypertension: Secondary | ICD-10-CM | POA: Diagnosis not present

## 2015-12-20 DIAGNOSIS — M810 Age-related osteoporosis without current pathological fracture: Secondary | ICD-10-CM | POA: Diagnosis not present

## 2015-12-20 DIAGNOSIS — I10 Essential (primary) hypertension: Secondary | ICD-10-CM | POA: Diagnosis not present

## 2015-12-20 DIAGNOSIS — M797 Fibromyalgia: Secondary | ICD-10-CM | POA: Diagnosis not present

## 2015-12-20 DIAGNOSIS — M6281 Muscle weakness (generalized): Secondary | ICD-10-CM | POA: Diagnosis not present

## 2015-12-20 DIAGNOSIS — S72142D Displaced intertrochanteric fracture of left femur, subsequent encounter for closed fracture with routine healing: Secondary | ICD-10-CM | POA: Diagnosis not present

## 2015-12-20 DIAGNOSIS — M5136 Other intervertebral disc degeneration, lumbar region: Secondary | ICD-10-CM | POA: Diagnosis not present

## 2015-12-22 DIAGNOSIS — M1288 Other specific arthropathies, not elsewhere classified, other specified site: Secondary | ICD-10-CM | POA: Diagnosis not present

## 2016-01-12 DIAGNOSIS — Z96649 Presence of unspecified artificial hip joint: Secondary | ICD-10-CM | POA: Diagnosis not present

## 2016-01-12 DIAGNOSIS — M978XXA Periprosthetic fracture around other internal prosthetic joint, initial encounter: Secondary | ICD-10-CM | POA: Diagnosis not present

## 2016-01-19 DIAGNOSIS — S72142D Displaced intertrochanteric fracture of left femur, subsequent encounter for closed fracture with routine healing: Secondary | ICD-10-CM | POA: Diagnosis not present

## 2016-01-25 DIAGNOSIS — M1288 Other specific arthropathies, not elsewhere classified, other specified site: Secondary | ICD-10-CM | POA: Diagnosis not present

## 2016-02-06 DIAGNOSIS — N200 Calculus of kidney: Secondary | ICD-10-CM | POA: Diagnosis not present

## 2016-02-13 DIAGNOSIS — M545 Low back pain: Secondary | ICD-10-CM | POA: Diagnosis not present

## 2016-02-13 DIAGNOSIS — E038 Other specified hypothyroidism: Secondary | ICD-10-CM | POA: Diagnosis not present

## 2016-02-13 DIAGNOSIS — I10 Essential (primary) hypertension: Secondary | ICD-10-CM | POA: Diagnosis not present

## 2016-03-28 DIAGNOSIS — M978XXD Periprosthetic fracture around other internal prosthetic joint, subsequent encounter: Secondary | ICD-10-CM | POA: Diagnosis not present

## 2016-03-28 DIAGNOSIS — Z96649 Presence of unspecified artificial hip joint: Secondary | ICD-10-CM | POA: Diagnosis not present

## 2016-04-12 DIAGNOSIS — M8588 Other specified disorders of bone density and structure, other site: Secondary | ICD-10-CM | POA: Diagnosis not present

## 2016-04-12 DIAGNOSIS — I10 Essential (primary) hypertension: Secondary | ICD-10-CM | POA: Diagnosis not present

## 2016-04-12 DIAGNOSIS — M4854XD Collapsed vertebra, not elsewhere classified, thoracic region, subsequent encounter for fracture with routine healing: Secondary | ICD-10-CM | POA: Diagnosis not present

## 2016-04-12 DIAGNOSIS — I700 Atherosclerosis of aorta: Secondary | ICD-10-CM | POA: Diagnosis not present

## 2016-04-12 DIAGNOSIS — M40204 Unspecified kyphosis, thoracic region: Secondary | ICD-10-CM | POA: Diagnosis not present

## 2016-04-12 DIAGNOSIS — M549 Dorsalgia, unspecified: Secondary | ICD-10-CM | POA: Diagnosis not present

## 2016-04-12 DIAGNOSIS — M545 Low back pain: Secondary | ICD-10-CM | POA: Diagnosis not present

## 2016-04-20 DIAGNOSIS — S22080A Wedge compression fracture of T11-T12 vertebra, initial encounter for closed fracture: Secondary | ICD-10-CM | POA: Diagnosis not present

## 2016-04-25 DIAGNOSIS — M810 Age-related osteoporosis without current pathological fracture: Secondary | ICD-10-CM | POA: Diagnosis not present

## 2016-06-22 DIAGNOSIS — M5416 Radiculopathy, lumbar region: Secondary | ICD-10-CM | POA: Diagnosis not present

## 2016-06-22 DIAGNOSIS — M545 Low back pain: Secondary | ICD-10-CM | POA: Diagnosis not present

## 2016-06-25 DIAGNOSIS — M5416 Radiculopathy, lumbar region: Secondary | ICD-10-CM | POA: Diagnosis not present

## 2016-06-26 DIAGNOSIS — M5416 Radiculopathy, lumbar region: Secondary | ICD-10-CM | POA: Diagnosis not present

## 2016-06-29 DIAGNOSIS — M5416 Radiculopathy, lumbar region: Secondary | ICD-10-CM | POA: Diagnosis not present

## 2016-07-10 DIAGNOSIS — M5416 Radiculopathy, lumbar region: Secondary | ICD-10-CM | POA: Diagnosis not present

## 2016-07-19 DIAGNOSIS — M545 Low back pain: Secondary | ICD-10-CM | POA: Diagnosis not present

## 2016-07-19 DIAGNOSIS — E038 Other specified hypothyroidism: Secondary | ICD-10-CM | POA: Diagnosis not present

## 2016-07-19 DIAGNOSIS — I10 Essential (primary) hypertension: Secondary | ICD-10-CM | POA: Diagnosis not present

## 2016-09-18 DIAGNOSIS — M25552 Pain in left hip: Secondary | ICD-10-CM | POA: Diagnosis not present

## 2016-10-22 DIAGNOSIS — Z131 Encounter for screening for diabetes mellitus: Secondary | ICD-10-CM | POA: Diagnosis not present

## 2016-10-22 DIAGNOSIS — Z0000 Encounter for general adult medical examination without abnormal findings: Secondary | ICD-10-CM | POA: Diagnosis not present

## 2016-10-22 DIAGNOSIS — M545 Low back pain: Secondary | ICD-10-CM | POA: Diagnosis not present

## 2016-10-22 DIAGNOSIS — I10 Essential (primary) hypertension: Secondary | ICD-10-CM | POA: Diagnosis not present

## 2016-10-22 DIAGNOSIS — Z1389 Encounter for screening for other disorder: Secondary | ICD-10-CM | POA: Diagnosis not present

## 2016-10-24 DIAGNOSIS — Z1211 Encounter for screening for malignant neoplasm of colon: Secondary | ICD-10-CM | POA: Diagnosis not present

## 2016-10-24 DIAGNOSIS — Z23 Encounter for immunization: Secondary | ICD-10-CM | POA: Diagnosis not present

## 2016-10-25 DIAGNOSIS — M25552 Pain in left hip: Secondary | ICD-10-CM | POA: Diagnosis not present

## 2016-10-25 DIAGNOSIS — Z96649 Presence of unspecified artificial hip joint: Secondary | ICD-10-CM | POA: Diagnosis not present

## 2016-10-25 DIAGNOSIS — M978XXD Periprosthetic fracture around other internal prosthetic joint, subsequent encounter: Secondary | ICD-10-CM | POA: Diagnosis not present

## 2016-10-29 DIAGNOSIS — M810 Age-related osteoporosis without current pathological fracture: Secondary | ICD-10-CM | POA: Diagnosis not present

## 2016-11-13 DIAGNOSIS — Z1231 Encounter for screening mammogram for malignant neoplasm of breast: Secondary | ICD-10-CM | POA: Diagnosis not present

## 2017-01-17 IMAGING — DX DG LUMBAR SPINE 1V
1 series · 1 of 1 positions shown · non-contrast
Comparison: [DATE] AP lumbar spine radiograph.

CLINICAL DATA: Fall. Low back pain. Prior lumbar spine surgery in
1917.

EXAM:
LUMBAR SPINE - 1 VIEW

[l-spine lat]
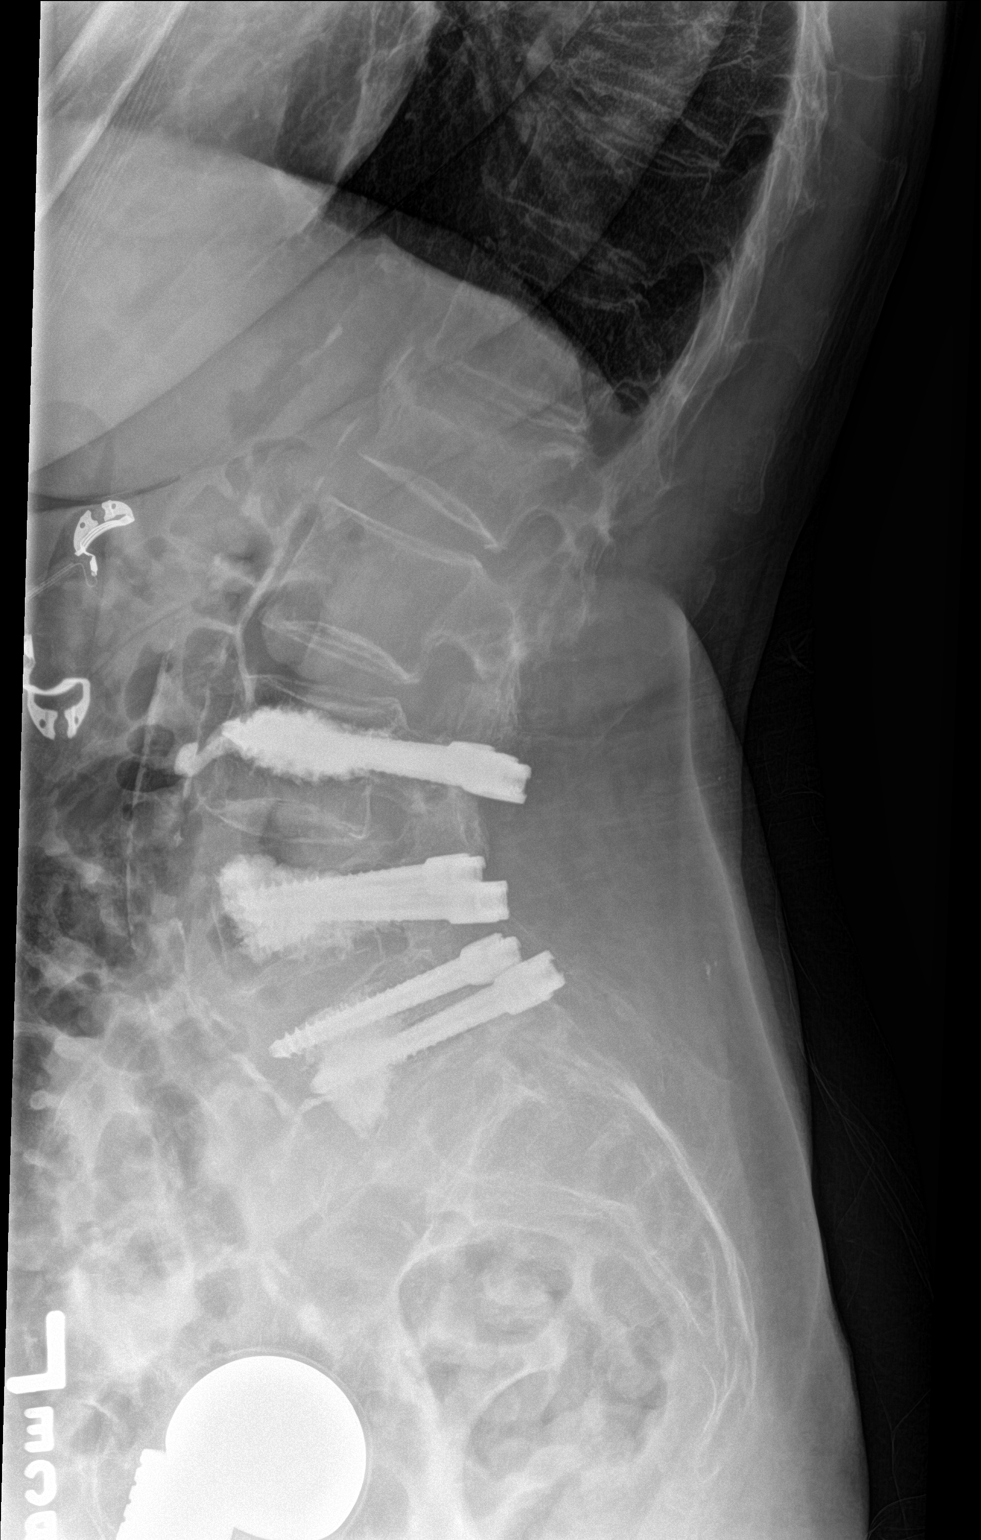

[1 of 1 positions shown; findings below may reference images not displayed]

FINDINGS: This report assumes 5 non rib-bearing lumbar vertebrae.

There is a moderate L1 vertebral body compression fracture of
indeterminate chronicity, with approximately 50% anterior loss of
vertebral body height. The L2, L3 and L5 vertebral body heights
appear preserved. There is mild approximately 30% loss of height of
the L4 vertebral body of indeterminate chronicity. There is 5 mm
anterolisthesis at L4-5. Pedicle screws are noted overlying the L3,
L4 and L5 vertebral levels, with no evidence of hardware fracture or
loosening. Atherosclerotic calcifications throughout the abdominal
aorta. Partially visualized hip arthroplasty hardware.

Mild degenerative disc disease at T12-L1 and L5-S1. No aggressive
appearing focal osseous lesions.
IMPRESSION: 1. Moderate L1 and mild L4 vertebral body compression fractures of
indeterminate chronicity.
2. Mild 5 mm anterolisthesis at L4-5.
3. Pedicle screws at L3 through L5, with no evidence of hardware
complication.

## 2017-01-17 IMAGING — CR DG LUMBAR SPINE 1V
1 series · 1 of 1 positions shown · non-contrast
Comparison: None.

CLINICAL DATA: Low back pain.

EXAM:
LUMBAR SPINE - 1 VIEW

[AP]
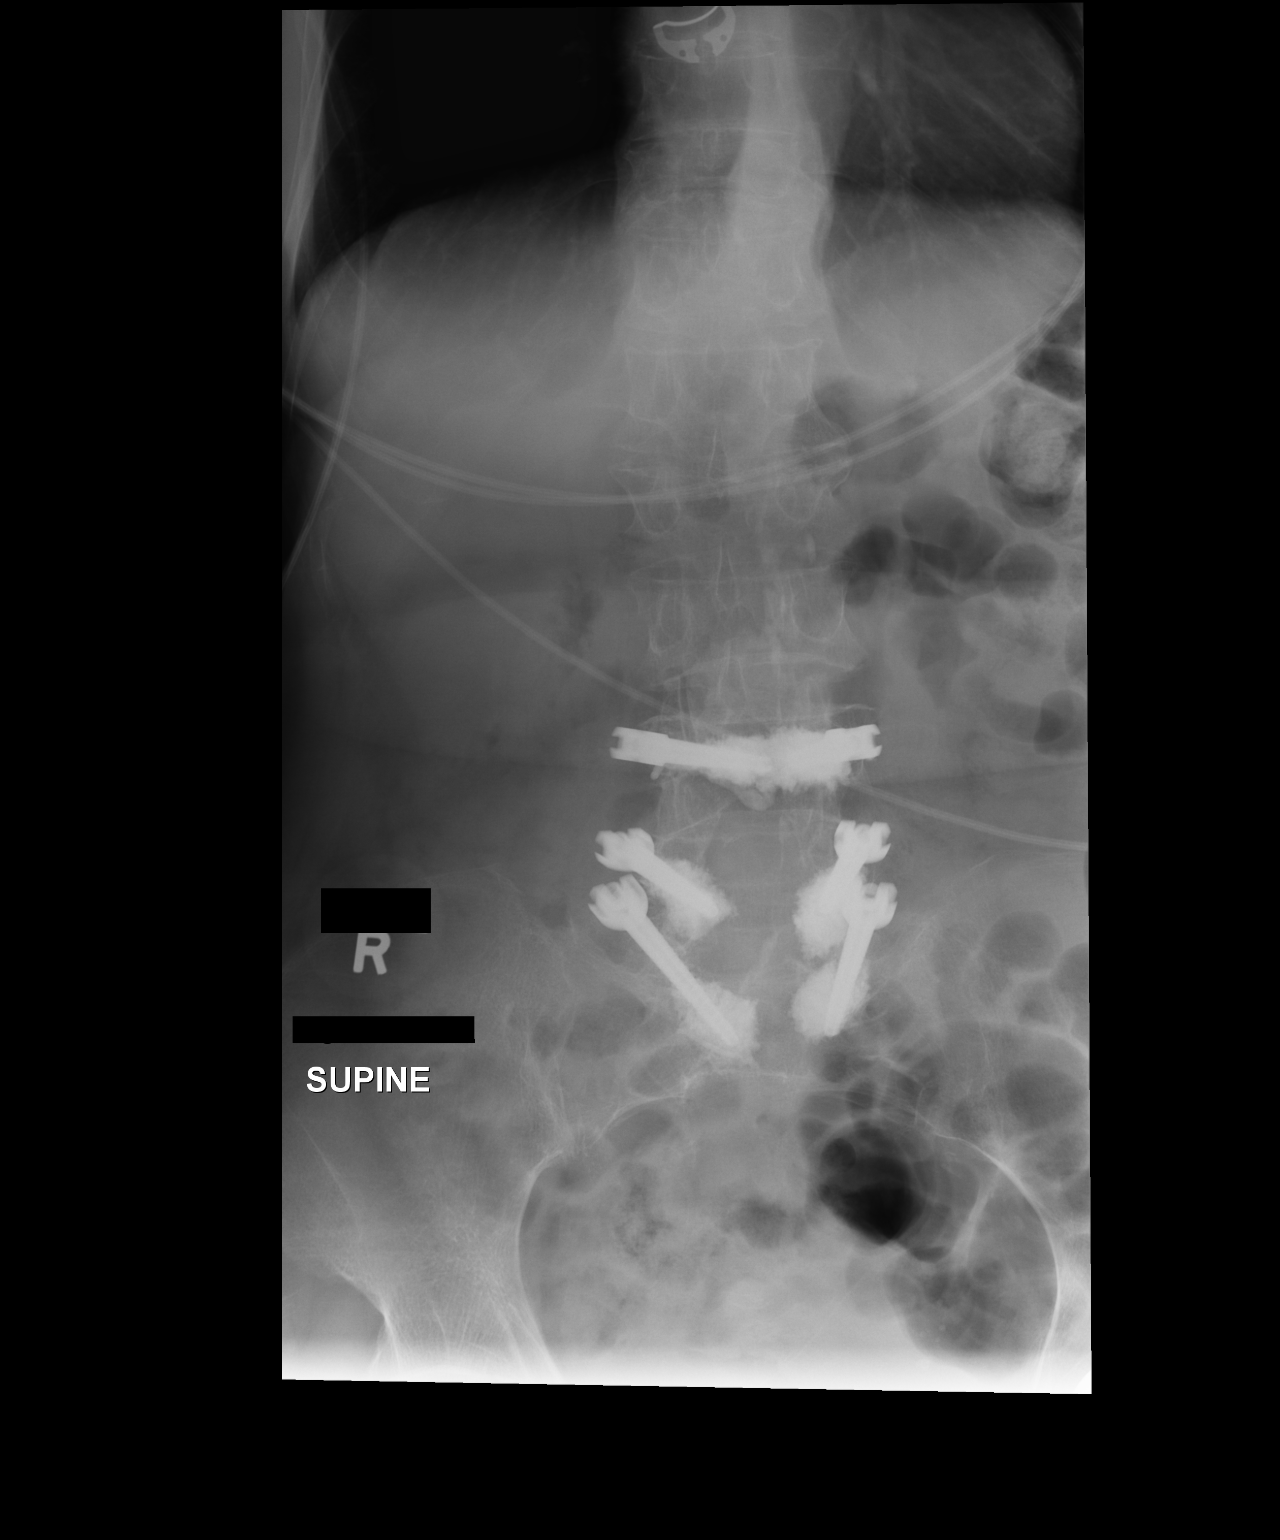

[1 of 1 positions shown; findings below may reference images not displayed]

FINDINGS: Single AP view of the lumbar spine is provided. There is
methylmethacrylate from prior vertebral body augmentation in the L4,
L5 and S1 vertebral bodies with bilateral pedicle screws at each
level. There are compression deformities of the L2 and L3 vertebral
bodies. There is generalized osteopenia.
IMPRESSION: Prior vertebral body augmentation at L4, L5 and S1 with bilateral
pedicle screws at each level. Compression deformities of the L2 and
L3 vertebral bodies on a single AP view. Recommend further
evaluation with a lateral view of the lumbar spine.

## 2017-01-17 IMAGING — CR DG PORTABLE PELVIS
1 series · 1 of 1 positions shown · non-contrast
Comparison: None.

CLINICAL DATA: Low back pain

EXAM:
PORTABLE PELVIS 1-2 VIEWS

[AP]
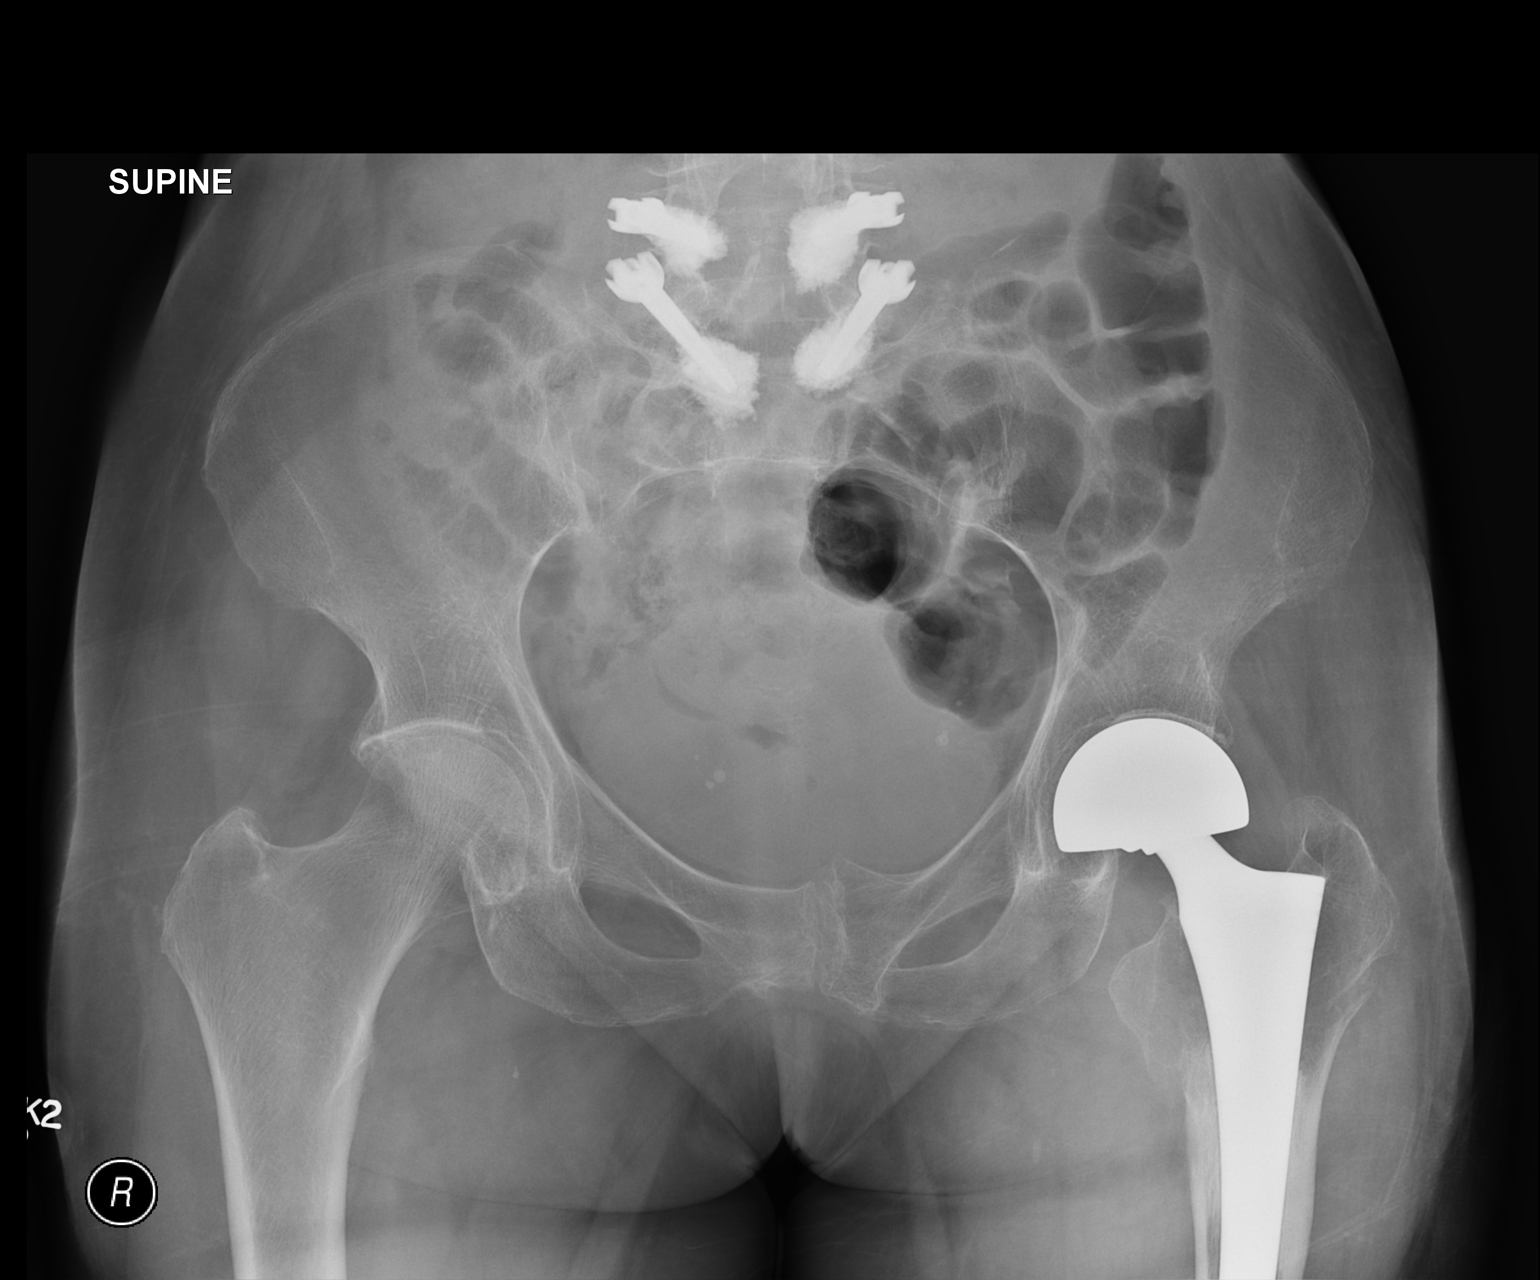

[1 of 1 positions shown; findings below may reference images not displayed]

FINDINGS: There is generalized osteopenia. There is a left hip arthroplasty.
There is no acute fracture or dislocation. There is mild
osteoarthritis of the right hip. There are degenerative changes of
bilateral SI joints.

There is prior augmentation of the L4, L5 and S1 vertebral bodies.
IMPRESSION: No acute osseous injury of the pelvis.

## 2017-01-18 IMAGING — CR DG PORTABLE PELVIS
1 series · 1 of 1 positions shown · non-contrast
Comparison: 10/08/2015

CLINICAL DATA: Repair of hip fracture

EXAM:
PORTABLE PELVIS 1-2 VIEWS

[AP]
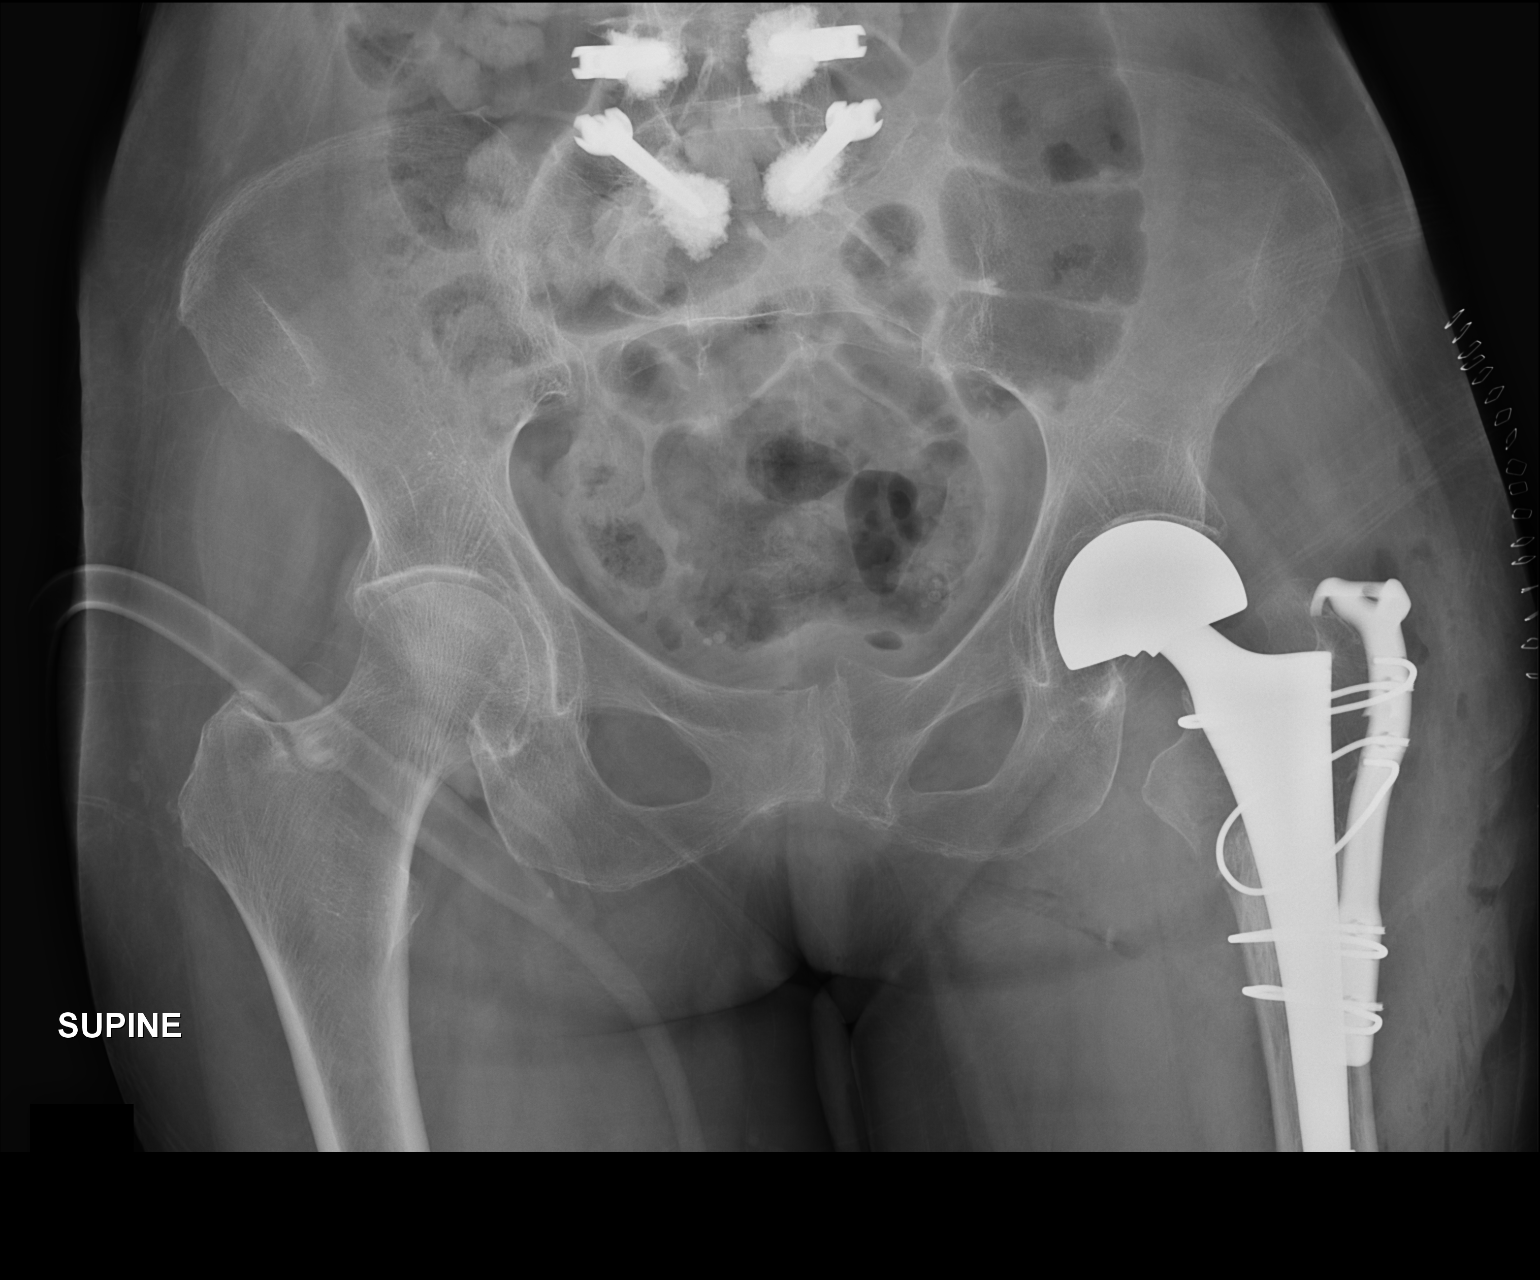

[1 of 1 positions shown; findings below may reference images not displayed]

FINDINGS: Left hip hemiarthroplasty. Superimposed fracture proximal femur has
been fixed with a lateral plate and cerclage wires. No dislocation.
No other fracture

Pedicle screws bilaterally at L3, L4, L5 without connecting rods.
IMPRESSION: Left hip hemiarthroplasty. Proximal femur fracture has been fixed
with a lateral plate and cerclage wires.

## 2017-01-25 DIAGNOSIS — I10 Essential (primary) hypertension: Secondary | ICD-10-CM | POA: Diagnosis not present

## 2017-01-25 DIAGNOSIS — M545 Low back pain: Secondary | ICD-10-CM | POA: Diagnosis not present

## 2017-04-29 DIAGNOSIS — E038 Other specified hypothyroidism: Secondary | ICD-10-CM | POA: Diagnosis not present

## 2017-04-29 DIAGNOSIS — Z79899 Other long term (current) drug therapy: Secondary | ICD-10-CM | POA: Diagnosis not present

## 2017-04-29 DIAGNOSIS — I10 Essential (primary) hypertension: Secondary | ICD-10-CM | POA: Diagnosis not present

## 2017-04-29 DIAGNOSIS — M545 Low back pain: Secondary | ICD-10-CM | POA: Diagnosis not present

## 2017-04-29 DIAGNOSIS — K210 Gastro-esophageal reflux disease with esophagitis: Secondary | ICD-10-CM | POA: Diagnosis not present

## 2017-04-29 DIAGNOSIS — M80852G Other osteoporosis with current pathological fracture, left femur, subsequent encounter for fracture with delayed healing: Secondary | ICD-10-CM | POA: Diagnosis not present

## 2017-06-17 DIAGNOSIS — M810 Age-related osteoporosis without current pathological fracture: Secondary | ICD-10-CM | POA: Diagnosis not present

## 2017-07-09 DIAGNOSIS — M810 Age-related osteoporosis without current pathological fracture: Secondary | ICD-10-CM | POA: Diagnosis not present

## 2017-08-15 DIAGNOSIS — K210 Gastro-esophageal reflux disease with esophagitis: Secondary | ICD-10-CM | POA: Diagnosis not present

## 2017-08-15 DIAGNOSIS — E038 Other specified hypothyroidism: Secondary | ICD-10-CM | POA: Diagnosis not present

## 2017-08-15 DIAGNOSIS — M545 Low back pain: Secondary | ICD-10-CM | POA: Diagnosis not present

## 2017-08-15 DIAGNOSIS — M80852G Other osteoporosis with current pathological fracture, left femur, subsequent encounter for fracture with delayed healing: Secondary | ICD-10-CM | POA: Diagnosis not present

## 2017-08-15 DIAGNOSIS — I10 Essential (primary) hypertension: Secondary | ICD-10-CM | POA: Diagnosis not present

## 2017-11-28 DIAGNOSIS — E038 Other specified hypothyroidism: Secondary | ICD-10-CM | POA: Diagnosis not present

## 2017-11-28 DIAGNOSIS — K210 Gastro-esophageal reflux disease with esophagitis: Secondary | ICD-10-CM | POA: Diagnosis not present

## 2017-11-28 DIAGNOSIS — M80852G Other osteoporosis with current pathological fracture, left femur, subsequent encounter for fracture with delayed healing: Secondary | ICD-10-CM | POA: Diagnosis not present

## 2017-11-28 DIAGNOSIS — M545 Low back pain: Secondary | ICD-10-CM | POA: Diagnosis not present

## 2017-11-28 DIAGNOSIS — I10 Essential (primary) hypertension: Secondary | ICD-10-CM | POA: Diagnosis not present

## 2017-11-28 DIAGNOSIS — Z0000 Encounter for general adult medical examination without abnormal findings: Secondary | ICD-10-CM | POA: Diagnosis not present

## 2017-11-28 DIAGNOSIS — Z1389 Encounter for screening for other disorder: Secondary | ICD-10-CM | POA: Diagnosis not present

## 2017-12-03 DIAGNOSIS — M810 Age-related osteoporosis without current pathological fracture: Secondary | ICD-10-CM | POA: Diagnosis not present

## 2018-03-05 DIAGNOSIS — I10 Essential (primary) hypertension: Secondary | ICD-10-CM | POA: Diagnosis not present

## 2018-03-05 DIAGNOSIS — K210 Gastro-esophageal reflux disease with esophagitis: Secondary | ICD-10-CM | POA: Diagnosis not present

## 2018-03-05 DIAGNOSIS — M80852G Other osteoporosis with current pathological fracture, left femur, subsequent encounter for fracture with delayed healing: Secondary | ICD-10-CM | POA: Diagnosis not present

## 2018-03-05 DIAGNOSIS — M545 Low back pain: Secondary | ICD-10-CM | POA: Diagnosis not present

## 2018-03-05 DIAGNOSIS — E038 Other specified hypothyroidism: Secondary | ICD-10-CM | POA: Diagnosis not present

## 2018-06-11 DIAGNOSIS — M545 Low back pain: Secondary | ICD-10-CM | POA: Diagnosis not present

## 2018-06-11 DIAGNOSIS — I10 Essential (primary) hypertension: Secondary | ICD-10-CM | POA: Diagnosis not present

## 2018-06-11 DIAGNOSIS — M80852G Other osteoporosis with current pathological fracture, left femur, subsequent encounter for fracture with delayed healing: Secondary | ICD-10-CM | POA: Diagnosis not present

## 2018-06-11 DIAGNOSIS — I498 Other specified cardiac arrhythmias: Secondary | ICD-10-CM | POA: Diagnosis not present

## 2018-06-11 DIAGNOSIS — E038 Other specified hypothyroidism: Secondary | ICD-10-CM | POA: Diagnosis not present

## 2018-06-11 DIAGNOSIS — K210 Gastro-esophageal reflux disease with esophagitis: Secondary | ICD-10-CM | POA: Diagnosis not present

## 2018-09-16 DIAGNOSIS — K210 Gastro-esophageal reflux disease with esophagitis: Secondary | ICD-10-CM | POA: Diagnosis not present

## 2018-09-16 DIAGNOSIS — I498 Other specified cardiac arrhythmias: Secondary | ICD-10-CM | POA: Diagnosis not present

## 2018-09-16 DIAGNOSIS — M545 Low back pain: Secondary | ICD-10-CM | POA: Diagnosis not present

## 2018-09-16 DIAGNOSIS — R5383 Other fatigue: Secondary | ICD-10-CM | POA: Diagnosis not present

## 2018-09-16 DIAGNOSIS — M80852G Other osteoporosis with current pathological fracture, left femur, subsequent encounter for fracture with delayed healing: Secondary | ICD-10-CM | POA: Diagnosis not present

## 2018-09-16 DIAGNOSIS — I10 Essential (primary) hypertension: Secondary | ICD-10-CM | POA: Diagnosis not present

## 2018-09-16 DIAGNOSIS — E038 Other specified hypothyroidism: Secondary | ICD-10-CM | POA: Diagnosis not present

## 2018-10-13 DIAGNOSIS — I509 Heart failure, unspecified: Secondary | ICD-10-CM | POA: Diagnosis not present

## 2018-10-13 DIAGNOSIS — K449 Diaphragmatic hernia without obstruction or gangrene: Secondary | ICD-10-CM | POA: Diagnosis present

## 2018-10-13 DIAGNOSIS — K254 Chronic or unspecified gastric ulcer with hemorrhage: Secondary | ICD-10-CM | POA: Diagnosis present

## 2018-10-13 DIAGNOSIS — Z87891 Personal history of nicotine dependence: Secondary | ICD-10-CM | POA: Diagnosis not present

## 2018-10-13 DIAGNOSIS — R197 Diarrhea, unspecified: Secondary | ICD-10-CM | POA: Diagnosis not present

## 2018-10-13 DIAGNOSIS — D62 Acute posthemorrhagic anemia: Secondary | ICD-10-CM | POA: Diagnosis present

## 2018-10-13 DIAGNOSIS — Z96642 Presence of left artificial hip joint: Secondary | ICD-10-CM | POA: Diagnosis present

## 2018-10-13 DIAGNOSIS — G8929 Other chronic pain: Secondary | ICD-10-CM | POA: Diagnosis present

## 2018-10-13 DIAGNOSIS — K922 Gastrointestinal hemorrhage, unspecified: Secondary | ICD-10-CM | POA: Diagnosis not present

## 2018-10-13 DIAGNOSIS — E039 Hypothyroidism, unspecified: Secondary | ICD-10-CM | POA: Diagnosis present

## 2018-10-13 DIAGNOSIS — K529 Noninfective gastroenteritis and colitis, unspecified: Secondary | ICD-10-CM | POA: Diagnosis not present

## 2018-10-13 DIAGNOSIS — I10 Essential (primary) hypertension: Secondary | ICD-10-CM | POA: Diagnosis present

## 2018-10-13 DIAGNOSIS — K219 Gastro-esophageal reflux disease without esophagitis: Secondary | ICD-10-CM | POA: Diagnosis present

## 2018-10-13 DIAGNOSIS — M545 Low back pain: Secondary | ICD-10-CM | POA: Diagnosis present

## 2018-10-13 DIAGNOSIS — D649 Anemia, unspecified: Secondary | ICD-10-CM | POA: Diagnosis not present

## 2018-10-13 DIAGNOSIS — D509 Iron deficiency anemia, unspecified: Secondary | ICD-10-CM | POA: Diagnosis not present

## 2018-10-23 DIAGNOSIS — K259 Gastric ulcer, unspecified as acute or chronic, without hemorrhage or perforation: Secondary | ICD-10-CM | POA: Diagnosis not present

## 2018-12-22 DIAGNOSIS — Z0000 Encounter for general adult medical examination without abnormal findings: Secondary | ICD-10-CM | POA: Diagnosis not present

## 2018-12-22 DIAGNOSIS — K210 Gastro-esophageal reflux disease with esophagitis: Secondary | ICD-10-CM | POA: Diagnosis not present

## 2018-12-22 DIAGNOSIS — I10 Essential (primary) hypertension: Secondary | ICD-10-CM | POA: Diagnosis not present

## 2018-12-22 DIAGNOSIS — Z1389 Encounter for screening for other disorder: Secondary | ICD-10-CM | POA: Diagnosis not present

## 2018-12-22 DIAGNOSIS — E785 Hyperlipidemia, unspecified: Secondary | ICD-10-CM | POA: Diagnosis not present

## 2019-03-30 DIAGNOSIS — K210 Gastro-esophageal reflux disease with esophagitis: Secondary | ICD-10-CM | POA: Diagnosis not present

## 2019-03-30 DIAGNOSIS — E785 Hyperlipidemia, unspecified: Secondary | ICD-10-CM | POA: Diagnosis not present

## 2019-03-30 DIAGNOSIS — D649 Anemia, unspecified: Secondary | ICD-10-CM | POA: Diagnosis not present

## 2019-03-30 DIAGNOSIS — R0602 Shortness of breath: Secondary | ICD-10-CM | POA: Diagnosis not present

## 2019-03-30 DIAGNOSIS — I10 Essential (primary) hypertension: Secondary | ICD-10-CM | POA: Diagnosis not present

## 2019-03-30 DIAGNOSIS — I509 Heart failure, unspecified: Secondary | ICD-10-CM | POA: Diagnosis not present

## 2019-04-08 DIAGNOSIS — D6489 Other specified anemias: Secondary | ICD-10-CM | POA: Diagnosis not present

## 2019-06-19 ENCOUNTER — Other Ambulatory Visit: Payer: Self-pay

## 2019-06-30 DIAGNOSIS — I10 Essential (primary) hypertension: Secondary | ICD-10-CM | POA: Diagnosis not present

## 2019-06-30 DIAGNOSIS — D649 Anemia, unspecified: Secondary | ICD-10-CM | POA: Diagnosis not present

## 2019-06-30 DIAGNOSIS — I509 Heart failure, unspecified: Secondary | ICD-10-CM | POA: Diagnosis not present

## 2019-06-30 DIAGNOSIS — E785 Hyperlipidemia, unspecified: Secondary | ICD-10-CM | POA: Diagnosis not present

## 2019-06-30 DIAGNOSIS — L74510 Primary focal hyperhidrosis, axilla: Secondary | ICD-10-CM | POA: Diagnosis not present

## 2019-10-07 DIAGNOSIS — K219 Gastro-esophageal reflux disease without esophagitis: Secondary | ICD-10-CM | POA: Diagnosis not present

## 2019-10-07 DIAGNOSIS — E039 Hypothyroidism, unspecified: Secondary | ICD-10-CM | POA: Diagnosis not present

## 2019-10-07 DIAGNOSIS — I10 Essential (primary) hypertension: Secondary | ICD-10-CM | POA: Diagnosis not present

## 2019-10-07 DIAGNOSIS — E785 Hyperlipidemia, unspecified: Secondary | ICD-10-CM | POA: Diagnosis not present

## 2020-01-12 DIAGNOSIS — E038 Other specified hypothyroidism: Secondary | ICD-10-CM | POA: Diagnosis not present

## 2020-01-12 DIAGNOSIS — K219 Gastro-esophageal reflux disease without esophagitis: Secondary | ICD-10-CM | POA: Diagnosis not present

## 2020-01-12 DIAGNOSIS — E7849 Other hyperlipidemia: Secondary | ICD-10-CM | POA: Diagnosis not present

## 2020-01-12 DIAGNOSIS — Z1389 Encounter for screening for other disorder: Secondary | ICD-10-CM | POA: Diagnosis not present

## 2020-01-12 DIAGNOSIS — I10 Essential (primary) hypertension: Secondary | ICD-10-CM | POA: Diagnosis not present

## 2020-01-12 DIAGNOSIS — Z0000 Encounter for general adult medical examination without abnormal findings: Secondary | ICD-10-CM | POA: Diagnosis not present

## 2020-04-11 DIAGNOSIS — Z0389 Encounter for observation for other suspected diseases and conditions ruled out: Secondary | ICD-10-CM | POA: Diagnosis not present

## 2020-04-11 DIAGNOSIS — M810 Age-related osteoporosis without current pathological fracture: Secondary | ICD-10-CM | POA: Diagnosis not present

## 2020-04-12 DIAGNOSIS — E7849 Other hyperlipidemia: Secondary | ICD-10-CM | POA: Diagnosis not present

## 2020-04-12 DIAGNOSIS — I10 Essential (primary) hypertension: Secondary | ICD-10-CM | POA: Diagnosis not present

## 2020-04-12 DIAGNOSIS — K219 Gastro-esophageal reflux disease without esophagitis: Secondary | ICD-10-CM | POA: Diagnosis not present

## 2020-04-12 DIAGNOSIS — E038 Other specified hypothyroidism: Secondary | ICD-10-CM | POA: Diagnosis not present

## 2020-04-12 DIAGNOSIS — G5601 Carpal tunnel syndrome, right upper limb: Secondary | ICD-10-CM | POA: Diagnosis not present

## 2020-07-19 DIAGNOSIS — G5601 Carpal tunnel syndrome, right upper limb: Secondary | ICD-10-CM | POA: Diagnosis not present

## 2020-07-19 DIAGNOSIS — E7849 Other hyperlipidemia: Secondary | ICD-10-CM | POA: Diagnosis not present

## 2020-07-19 DIAGNOSIS — K219 Gastro-esophageal reflux disease without esophagitis: Secondary | ICD-10-CM | POA: Diagnosis not present

## 2020-07-19 DIAGNOSIS — I10 Essential (primary) hypertension: Secondary | ICD-10-CM | POA: Diagnosis not present

## 2020-07-19 DIAGNOSIS — E038 Other specified hypothyroidism: Secondary | ICD-10-CM | POA: Diagnosis not present

## 2022-12-07 DIAGNOSIS — S42202D Unspecified fracture of upper end of left humerus, subsequent encounter for fracture with routine healing: Secondary | ICD-10-CM | POA: Diagnosis not present

## 2023-02-07 DIAGNOSIS — Z6826 Body mass index (BMI) 26.0-26.9, adult: Secondary | ICD-10-CM | POA: Diagnosis not present

## 2023-02-07 DIAGNOSIS — I10 Essential (primary) hypertension: Secondary | ICD-10-CM | POA: Diagnosis not present

## 2023-02-07 DIAGNOSIS — Z0000 Encounter for general adult medical examination without abnormal findings: Secondary | ICD-10-CM | POA: Diagnosis not present

## 2023-02-07 DIAGNOSIS — I700 Atherosclerosis of aorta: Secondary | ICD-10-CM | POA: Diagnosis not present

## 2023-02-07 DIAGNOSIS — L639 Alopecia areata, unspecified: Secondary | ICD-10-CM | POA: Diagnosis not present

## 2023-02-07 DIAGNOSIS — J449 Chronic obstructive pulmonary disease, unspecified: Secondary | ICD-10-CM | POA: Diagnosis not present

## 2023-02-07 DIAGNOSIS — M5431 Sciatica, right side: Secondary | ICD-10-CM | POA: Diagnosis not present

## 2023-02-21 DIAGNOSIS — Z1231 Encounter for screening mammogram for malignant neoplasm of breast: Secondary | ICD-10-CM | POA: Diagnosis not present

## 2023-02-26 DIAGNOSIS — M810 Age-related osteoporosis without current pathological fracture: Secondary | ICD-10-CM | POA: Diagnosis not present

## 2023-05-15 DIAGNOSIS — E039 Hypothyroidism, unspecified: Secondary | ICD-10-CM | POA: Diagnosis not present

## 2023-05-15 DIAGNOSIS — I10 Essential (primary) hypertension: Secondary | ICD-10-CM | POA: Diagnosis not present

## 2023-05-15 DIAGNOSIS — Z79899 Other long term (current) drug therapy: Secondary | ICD-10-CM | POA: Diagnosis not present

## 2023-05-15 DIAGNOSIS — E7849 Other hyperlipidemia: Secondary | ICD-10-CM | POA: Diagnosis not present

## 2023-05-15 DIAGNOSIS — Z0000 Encounter for general adult medical examination without abnormal findings: Secondary | ICD-10-CM | POA: Diagnosis not present

## 2023-05-15 DIAGNOSIS — M5431 Sciatica, right side: Secondary | ICD-10-CM | POA: Diagnosis not present

## 2023-05-15 DIAGNOSIS — J449 Chronic obstructive pulmonary disease, unspecified: Secondary | ICD-10-CM | POA: Diagnosis not present

## 2023-05-15 DIAGNOSIS — L639 Alopecia areata, unspecified: Secondary | ICD-10-CM | POA: Diagnosis not present

## 2023-05-15 DIAGNOSIS — I700 Atherosclerosis of aorta: Secondary | ICD-10-CM | POA: Diagnosis not present

## 2023-05-15 DIAGNOSIS — Z6827 Body mass index (BMI) 27.0-27.9, adult: Secondary | ICD-10-CM | POA: Diagnosis not present

## 2023-06-18 DIAGNOSIS — R197 Diarrhea, unspecified: Secondary | ICD-10-CM | POA: Diagnosis not present

## 2023-09-11 DIAGNOSIS — I700 Atherosclerosis of aorta: Secondary | ICD-10-CM | POA: Diagnosis not present

## 2023-09-11 DIAGNOSIS — Z6826 Body mass index (BMI) 26.0-26.9, adult: Secondary | ICD-10-CM | POA: Diagnosis not present

## 2023-09-11 DIAGNOSIS — Z0000 Encounter for general adult medical examination without abnormal findings: Secondary | ICD-10-CM | POA: Diagnosis not present

## 2023-09-11 DIAGNOSIS — N1831 Chronic kidney disease, stage 3a: Secondary | ICD-10-CM | POA: Diagnosis not present

## 2023-09-11 DIAGNOSIS — J449 Chronic obstructive pulmonary disease, unspecified: Secondary | ICD-10-CM | POA: Diagnosis not present

## 2023-09-11 DIAGNOSIS — M5431 Sciatica, right side: Secondary | ICD-10-CM | POA: Diagnosis not present

## 2023-09-11 DIAGNOSIS — I10 Essential (primary) hypertension: Secondary | ICD-10-CM | POA: Diagnosis not present

## 2023-09-11 DIAGNOSIS — L639 Alopecia areata, unspecified: Secondary | ICD-10-CM | POA: Diagnosis not present

## 2023-10-28 DIAGNOSIS — Z6828 Body mass index (BMI) 28.0-28.9, adult: Secondary | ICD-10-CM | POA: Diagnosis not present

## 2023-10-28 DIAGNOSIS — I10 Essential (primary) hypertension: Secondary | ICD-10-CM | POA: Diagnosis not present

## 2023-10-28 DIAGNOSIS — R0602 Shortness of breath: Secondary | ICD-10-CM | POA: Diagnosis not present

## 2023-10-28 DIAGNOSIS — H814 Vertigo of central origin: Secondary | ICD-10-CM | POA: Diagnosis not present

## 2023-10-30 DIAGNOSIS — I351 Nonrheumatic aortic (valve) insufficiency: Secondary | ICD-10-CM | POA: Diagnosis not present

## 2023-10-30 DIAGNOSIS — I272 Pulmonary hypertension, unspecified: Secondary | ICD-10-CM | POA: Diagnosis not present

## 2023-10-30 DIAGNOSIS — I340 Nonrheumatic mitral (valve) insufficiency: Secondary | ICD-10-CM | POA: Diagnosis not present

## 2023-10-30 DIAGNOSIS — R42 Dizziness and giddiness: Secondary | ICD-10-CM | POA: Diagnosis not present

## 2023-10-30 DIAGNOSIS — R0602 Shortness of breath: Secondary | ICD-10-CM | POA: Diagnosis not present

## 2023-12-05 DIAGNOSIS — I10 Essential (primary) hypertension: Secondary | ICD-10-CM | POA: Diagnosis not present

## 2023-12-05 DIAGNOSIS — L309 Dermatitis, unspecified: Secondary | ICD-10-CM | POA: Diagnosis not present

## 2023-12-05 DIAGNOSIS — Z0000 Encounter for general adult medical examination without abnormal findings: Secondary | ICD-10-CM | POA: Diagnosis not present

## 2023-12-05 DIAGNOSIS — H814 Vertigo of central origin: Secondary | ICD-10-CM | POA: Diagnosis not present

## 2023-12-05 DIAGNOSIS — Z6828 Body mass index (BMI) 28.0-28.9, adult: Secondary | ICD-10-CM | POA: Diagnosis not present

## 2023-12-05 DIAGNOSIS — N1831 Chronic kidney disease, stage 3a: Secondary | ICD-10-CM | POA: Diagnosis not present

## 2023-12-05 DIAGNOSIS — I5032 Chronic diastolic (congestive) heart failure: Secondary | ICD-10-CM | POA: Diagnosis not present

## 2023-12-05 LAB — LAB REPORT - SCANNED: EGFR (Non-African Amer.): 78

## 2023-12-19 DIAGNOSIS — J9600 Acute respiratory failure, unspecified whether with hypoxia or hypercapnia: Secondary | ICD-10-CM | POA: Diagnosis not present

## 2023-12-19 DIAGNOSIS — J1282 Pneumonia due to coronavirus disease 2019: Secondary | ICD-10-CM | POA: Diagnosis not present

## 2023-12-19 DIAGNOSIS — R0600 Dyspnea, unspecified: Secondary | ICD-10-CM | POA: Diagnosis not present

## 2023-12-19 DIAGNOSIS — G894 Chronic pain syndrome: Secondary | ICD-10-CM | POA: Diagnosis not present

## 2023-12-19 DIAGNOSIS — I10 Essential (primary) hypertension: Secondary | ICD-10-CM | POA: Diagnosis not present

## 2023-12-19 DIAGNOSIS — W19XXXA Unspecified fall, initial encounter: Secondary | ICD-10-CM | POA: Diagnosis not present

## 2023-12-19 DIAGNOSIS — D6859 Other primary thrombophilia: Secondary | ICD-10-CM | POA: Diagnosis not present

## 2023-12-19 DIAGNOSIS — I21A1 Myocardial infarction type 2: Secondary | ICD-10-CM | POA: Diagnosis not present

## 2023-12-19 DIAGNOSIS — U071 COVID-19: Secondary | ICD-10-CM | POA: Diagnosis not present

## 2023-12-19 DIAGNOSIS — R55 Syncope and collapse: Secondary | ICD-10-CM | POA: Diagnosis not present

## 2023-12-19 DIAGNOSIS — S199XXA Unspecified injury of neck, initial encounter: Secondary | ICD-10-CM | POA: Diagnosis not present

## 2023-12-19 DIAGNOSIS — M6282 Rhabdomyolysis: Secondary | ICD-10-CM | POA: Diagnosis not present

## 2023-12-19 DIAGNOSIS — M542 Cervicalgia: Secondary | ICD-10-CM | POA: Diagnosis not present

## 2023-12-19 DIAGNOSIS — J9601 Acute respiratory failure with hypoxia: Secondary | ICD-10-CM | POA: Diagnosis not present

## 2023-12-19 DIAGNOSIS — R410 Disorientation, unspecified: Secondary | ICD-10-CM | POA: Diagnosis not present

## 2023-12-19 DIAGNOSIS — E441 Mild protein-calorie malnutrition: Secondary | ICD-10-CM | POA: Diagnosis not present

## 2023-12-19 DIAGNOSIS — M545 Low back pain, unspecified: Secondary | ICD-10-CM | POA: Diagnosis not present

## 2023-12-20 DIAGNOSIS — M6282 Rhabdomyolysis: Secondary | ICD-10-CM | POA: Diagnosis not present

## 2023-12-20 DIAGNOSIS — R55 Syncope and collapse: Secondary | ICD-10-CM | POA: Diagnosis not present

## 2023-12-20 DIAGNOSIS — G894 Chronic pain syndrome: Secondary | ICD-10-CM | POA: Diagnosis not present

## 2023-12-21 DIAGNOSIS — R55 Syncope and collapse: Secondary | ICD-10-CM | POA: Diagnosis not present

## 2023-12-21 DIAGNOSIS — M6282 Rhabdomyolysis: Secondary | ICD-10-CM | POA: Diagnosis not present

## 2023-12-21 DIAGNOSIS — G894 Chronic pain syndrome: Secondary | ICD-10-CM | POA: Diagnosis not present

## 2023-12-23 DIAGNOSIS — I5032 Chronic diastolic (congestive) heart failure: Secondary | ICD-10-CM | POA: Diagnosis not present

## 2023-12-23 DIAGNOSIS — I10 Essential (primary) hypertension: Secondary | ICD-10-CM | POA: Diagnosis not present

## 2023-12-23 DIAGNOSIS — N3091 Cystitis, unspecified with hematuria: Secondary | ICD-10-CM | POA: Diagnosis not present

## 2023-12-23 DIAGNOSIS — Z6828 Body mass index (BMI) 28.0-28.9, adult: Secondary | ICD-10-CM | POA: Diagnosis not present

## 2023-12-23 DIAGNOSIS — U071 COVID-19: Secondary | ICD-10-CM | POA: Diagnosis not present

## 2023-12-26 DIAGNOSIS — J1281 Pneumonia due to SARS-associated coronavirus: Secondary | ICD-10-CM | POA: Diagnosis not present

## 2023-12-26 DIAGNOSIS — E039 Hypothyroidism, unspecified: Secondary | ICD-10-CM | POA: Diagnosis not present

## 2023-12-26 DIAGNOSIS — I10 Essential (primary) hypertension: Secondary | ICD-10-CM | POA: Diagnosis not present

## 2023-12-26 DIAGNOSIS — E441 Mild protein-calorie malnutrition: Secondary | ICD-10-CM | POA: Diagnosis not present

## 2023-12-26 DIAGNOSIS — J9601 Acute respiratory failure with hypoxia: Secondary | ICD-10-CM | POA: Diagnosis not present

## 2023-12-26 DIAGNOSIS — U071 COVID-19: Secondary | ICD-10-CM | POA: Diagnosis not present

## 2023-12-26 DIAGNOSIS — E038 Other specified hypothyroidism: Secondary | ICD-10-CM | POA: Diagnosis not present

## 2023-12-27 DIAGNOSIS — U071 COVID-19: Secondary | ICD-10-CM | POA: Diagnosis not present

## 2023-12-27 DIAGNOSIS — J1281 Pneumonia due to SARS-associated coronavirus: Secondary | ICD-10-CM | POA: Diagnosis not present

## 2023-12-27 DIAGNOSIS — J9601 Acute respiratory failure with hypoxia: Secondary | ICD-10-CM | POA: Diagnosis not present

## 2023-12-27 DIAGNOSIS — E441 Mild protein-calorie malnutrition: Secondary | ICD-10-CM | POA: Diagnosis not present

## 2023-12-27 DIAGNOSIS — E039 Hypothyroidism, unspecified: Secondary | ICD-10-CM | POA: Diagnosis not present

## 2023-12-27 DIAGNOSIS — I10 Essential (primary) hypertension: Secondary | ICD-10-CM | POA: Diagnosis not present

## 2024-01-03 DIAGNOSIS — E441 Mild protein-calorie malnutrition: Secondary | ICD-10-CM | POA: Diagnosis not present

## 2024-01-03 DIAGNOSIS — J9601 Acute respiratory failure with hypoxia: Secondary | ICD-10-CM | POA: Diagnosis not present

## 2024-01-03 DIAGNOSIS — E039 Hypothyroidism, unspecified: Secondary | ICD-10-CM | POA: Diagnosis not present

## 2024-01-03 DIAGNOSIS — U071 COVID-19: Secondary | ICD-10-CM | POA: Diagnosis not present

## 2024-01-03 DIAGNOSIS — J1281 Pneumonia due to SARS-associated coronavirus: Secondary | ICD-10-CM | POA: Diagnosis not present

## 2024-01-03 DIAGNOSIS — I10 Essential (primary) hypertension: Secondary | ICD-10-CM | POA: Diagnosis not present

## 2024-01-15 DIAGNOSIS — M818 Other osteoporosis without current pathological fracture: Secondary | ICD-10-CM | POA: Diagnosis not present

## 2024-01-15 DIAGNOSIS — I5032 Chronic diastolic (congestive) heart failure: Secondary | ICD-10-CM | POA: Diagnosis not present

## 2024-01-15 DIAGNOSIS — Z6828 Body mass index (BMI) 28.0-28.9, adult: Secondary | ICD-10-CM | POA: Diagnosis not present

## 2024-01-15 DIAGNOSIS — N182 Chronic kidney disease, stage 2 (mild): Secondary | ICD-10-CM | POA: Diagnosis not present

## 2024-01-15 DIAGNOSIS — I10 Essential (primary) hypertension: Secondary | ICD-10-CM | POA: Diagnosis not present

## 2024-01-16 DIAGNOSIS — U071 COVID-19: Secondary | ICD-10-CM | POA: Diagnosis not present

## 2024-01-16 DIAGNOSIS — E441 Mild protein-calorie malnutrition: Secondary | ICD-10-CM | POA: Diagnosis not present

## 2024-01-16 DIAGNOSIS — I10 Essential (primary) hypertension: Secondary | ICD-10-CM | POA: Diagnosis not present

## 2024-01-16 DIAGNOSIS — E039 Hypothyroidism, unspecified: Secondary | ICD-10-CM | POA: Diagnosis not present

## 2024-01-16 DIAGNOSIS — J9601 Acute respiratory failure with hypoxia: Secondary | ICD-10-CM | POA: Diagnosis not present

## 2024-01-16 DIAGNOSIS — J1281 Pneumonia due to SARS-associated coronavirus: Secondary | ICD-10-CM | POA: Diagnosis not present

## 2024-02-17 DIAGNOSIS — H524 Presbyopia: Secondary | ICD-10-CM | POA: Diagnosis not present

## 2024-02-17 DIAGNOSIS — H02834 Dermatochalasis of left upper eyelid: Secondary | ICD-10-CM | POA: Diagnosis not present

## 2024-02-17 DIAGNOSIS — H25813 Combined forms of age-related cataract, bilateral: Secondary | ICD-10-CM | POA: Diagnosis not present

## 2024-02-17 DIAGNOSIS — H5203 Hypermetropia, bilateral: Secondary | ICD-10-CM | POA: Diagnosis not present

## 2024-02-17 DIAGNOSIS — H02831 Dermatochalasis of right upper eyelid: Secondary | ICD-10-CM | POA: Diagnosis not present

## 2024-03-05 DIAGNOSIS — N182 Chronic kidney disease, stage 2 (mild): Secondary | ICD-10-CM | POA: Diagnosis not present

## 2024-03-05 DIAGNOSIS — E038 Other specified hypothyroidism: Secondary | ICD-10-CM | POA: Diagnosis not present

## 2024-03-05 DIAGNOSIS — Z6825 Body mass index (BMI) 25.0-25.9, adult: Secondary | ICD-10-CM | POA: Diagnosis not present

## 2024-03-05 DIAGNOSIS — M818 Other osteoporosis without current pathological fracture: Secondary | ICD-10-CM | POA: Diagnosis not present

## 2024-03-05 DIAGNOSIS — I10 Essential (primary) hypertension: Secondary | ICD-10-CM | POA: Diagnosis not present

## 2024-03-05 DIAGNOSIS — I5032 Chronic diastolic (congestive) heart failure: Secondary | ICD-10-CM | POA: Diagnosis not present

## 2024-03-10 DIAGNOSIS — I5032 Chronic diastolic (congestive) heart failure: Secondary | ICD-10-CM | POA: Diagnosis not present

## 2024-03-10 DIAGNOSIS — R5383 Other fatigue: Secondary | ICD-10-CM | POA: Diagnosis not present

## 2024-03-10 DIAGNOSIS — N182 Chronic kidney disease, stage 2 (mild): Secondary | ICD-10-CM | POA: Diagnosis not present

## 2024-03-10 DIAGNOSIS — D649 Anemia, unspecified: Secondary | ICD-10-CM | POA: Diagnosis not present

## 2024-03-10 DIAGNOSIS — E038 Other specified hypothyroidism: Secondary | ICD-10-CM | POA: Diagnosis not present

## 2024-03-10 DIAGNOSIS — M818 Other osteoporosis without current pathological fracture: Secondary | ICD-10-CM | POA: Diagnosis not present

## 2024-03-10 DIAGNOSIS — I10 Essential (primary) hypertension: Secondary | ICD-10-CM | POA: Diagnosis not present

## 2024-03-17 LAB — LAB REPORT - SCANNED: EGFR (Non-African Amer.): 21

## 2024-03-31 DIAGNOSIS — R5383 Other fatigue: Secondary | ICD-10-CM | POA: Diagnosis not present

## 2024-03-31 DIAGNOSIS — I5032 Chronic diastolic (congestive) heart failure: Secondary | ICD-10-CM | POA: Diagnosis not present

## 2024-03-31 DIAGNOSIS — M818 Other osteoporosis without current pathological fracture: Secondary | ICD-10-CM | POA: Diagnosis not present

## 2024-03-31 DIAGNOSIS — N182 Chronic kidney disease, stage 2 (mild): Secondary | ICD-10-CM | POA: Diagnosis not present

## 2024-03-31 DIAGNOSIS — D649 Anemia, unspecified: Secondary | ICD-10-CM | POA: Diagnosis not present

## 2024-03-31 DIAGNOSIS — I10 Essential (primary) hypertension: Secondary | ICD-10-CM | POA: Diagnosis not present

## 2024-03-31 DIAGNOSIS — Z6825 Body mass index (BMI) 25.0-25.9, adult: Secondary | ICD-10-CM | POA: Diagnosis not present

## 2024-03-31 DIAGNOSIS — E038 Other specified hypothyroidism: Secondary | ICD-10-CM | POA: Diagnosis not present

## 2024-03-31 DIAGNOSIS — Z0000 Encounter for general adult medical examination without abnormal findings: Secondary | ICD-10-CM | POA: Diagnosis not present

## 2024-04-01 LAB — LAB REPORT - SCANNED: EGFR (Non-African Amer.): 40

## 2024-04-16 DIAGNOSIS — N182 Chronic kidney disease, stage 2 (mild): Secondary | ICD-10-CM | POA: Diagnosis not present

## 2024-04-16 DIAGNOSIS — Z6826 Body mass index (BMI) 26.0-26.9, adult: Secondary | ICD-10-CM | POA: Diagnosis not present

## 2024-04-16 DIAGNOSIS — I10 Essential (primary) hypertension: Secondary | ICD-10-CM | POA: Diagnosis not present

## 2024-04-16 DIAGNOSIS — D649 Anemia, unspecified: Secondary | ICD-10-CM | POA: Diagnosis not present

## 2024-04-16 DIAGNOSIS — M818 Other osteoporosis without current pathological fracture: Secondary | ICD-10-CM | POA: Diagnosis not present

## 2024-04-16 DIAGNOSIS — R5383 Other fatigue: Secondary | ICD-10-CM | POA: Diagnosis not present

## 2024-04-16 DIAGNOSIS — E038 Other specified hypothyroidism: Secondary | ICD-10-CM | POA: Diagnosis not present

## 2024-04-16 DIAGNOSIS — I5032 Chronic diastolic (congestive) heart failure: Secondary | ICD-10-CM | POA: Diagnosis not present

## 2024-04-17 LAB — BASIC METABOLIC PANEL WITH GFR: EGFR (Non-African Amer.): 41

## 2024-04-30 ENCOUNTER — Encounter: Payer: Self-pay | Admitting: Internal Medicine

## 2024-04-30 ENCOUNTER — Ambulatory Visit: Attending: Internal Medicine | Admitting: Internal Medicine

## 2024-04-30 VITALS — BP 128/64 | HR 62 | Ht <= 58 in | Wt 121.8 lb

## 2024-04-30 DIAGNOSIS — I351 Nonrheumatic aortic (valve) insufficiency: Secondary | ICD-10-CM | POA: Diagnosis not present

## 2024-04-30 DIAGNOSIS — I5022 Chronic systolic (congestive) heart failure: Secondary | ICD-10-CM | POA: Diagnosis not present

## 2024-04-30 DIAGNOSIS — I340 Nonrheumatic mitral (valve) insufficiency: Secondary | ICD-10-CM | POA: Diagnosis not present

## 2024-04-30 DIAGNOSIS — Z79899 Other long term (current) drug therapy: Secondary | ICD-10-CM | POA: Diagnosis not present

## 2024-04-30 DIAGNOSIS — I10 Essential (primary) hypertension: Secondary | ICD-10-CM | POA: Diagnosis not present

## 2024-04-30 MED ORDER — DAPAGLIFLOZIN PROPANEDIOL 10 MG PO TABS: 10.0000 mg | ORAL_TABLET | Freq: Every day | ORAL | 5 refills | Status: AC

## 2024-04-30 NOTE — Progress Notes (Addendum)
 Cardiology Office Note  Date: 04/30/2024   ID: Mimie Goering, DOB 1945-11-28, MRN [id number]  PCP:  Orpha Yancey LABOR, MD  Cardiologist:  None Electrophysiologist:  None   History of Present Illness: Cheyenne King is a 78 y.o. female  Referred to cardiology clinic for the management of chronic systolic heart failure.  Accompanied by twin sister and niece.  Collateral history is obtained from the twin sister and the niece.  Records reviewed.  Echocardiogram in December 2024 showed LVEF 45 to 50%, moderate MR and mild AR.  Her PCP ordered echocardiogram in December 2024 due to DOE and that noted reduced LVEF.  She is currently on GDMT and has noticed her DOE significantly improved.  However she is continues to have DOE intermittently.  She still has leg swelling.  She was dizzy around 1 week ago when she was shopping.  Thinks she might be dehydrated.  No syncope, palpitations.  No angina.  Past Medical History:  Diagnosis Date   Anemia    Arthritis    Essential hypertension    Fibromyalgia    GERD (gastroesophageal reflux disease)    Hypothyroidism    Osteoporosis    Pneumonia    as an infant and [id number]    Past Surgical History:  Procedure Laterality Date   ABDOMINAL HYSTERECTOMY  1975   POLYPS IN UTERUS-REMOVAL   BACK SURGERY     L3,4,5   EYE SURGERY     REMOVAL OF METAL OBJECT IN LEFT EYE   FRACTURE SURGERY     RIGHT FOOT   HIP ARTHROPLASTY Left 10/09/2015   Procedure: LEFT INTERTROCHANTERIC  ORIF  ;  Surgeon: Oneil JAYSON Herald, MD;  Location: [facility] OR;  Service: Orthopedics;  Laterality: Left;   TONSILLECTOMY      Current Outpatient Medications  Medication Sig Dispense Refill   HYDROcodone -acetaminophen  (NORCO) 10-325 MG tablet Take 1 tablet by mouth every 6 (six) hours as needed. 60 tablet 0   levothyroxine  (SYNTHROID , LEVOTHROID) 50 MCG tablet Take 50 mcg by mouth daily before breakfast.     ranitidine (ZANTAC) 150 MG tablet Take 150 mg by mouth daily after supper.      aspirin  EC 325 MG EC tablet Take 1 tablet (325 mg total) by mouth daily with breakfast. 30 tablet 0   No current facility-administered medications for this visit.   Allergies:  Gabapentin, Neosporin original [bacitracin-neomycin-polymyxin], and Penicillins   Social History: The patient  reports that she quit smoking about 21 years ago. Her smoking use included cigarettes. She started smoking about 71 years ago. She has a 75 pack-year smoking history. She does not have any smokeless tobacco history on file. She reports that she does not drink alcohol and does not use drugs.   Family History: The patient's family history includes Arthritis/Rheumatoid in her sister; CAD in her mother; Diabetes Mellitus II in her father and mother; Heart attack in her brother and father; Stroke in her mother.   ROS:  Please see the history of present illness. Otherwise, complete review of systems is positive for none  All other systems are reviewed and negative.   Physical Exam: VS:  Ht 4' 10 (1.473 m)   Wt 121 lb 12.8 oz (55.2 kg)   BMI 25.46 kg/m , BMI Body mass index is 25.46 kg/m.  Wt Readings from Last 3 Encounters:  04/30/24 121 lb 12.8 oz (55.2 kg)  10/10/15 133 lb 8 oz (60.6 kg)    General: Patient appears comfortable at rest.  HEENT: Conjunctiva and lids normal, oropharynx clear with moist mucosa. Neck: Supple, no elevated JVP or carotid bruits, no thyromegaly. Lungs: Clear to auscultation, nonlabored breathing at rest. Cardiac: Regular rate and rhythm, no S3 or significant systolic murmur, no pericardial rub. Abdomen: Soft, nontender, no hepatomegaly, bowel sounds present, no guarding or rebound. Extremities: 1-2+ pitting edema Skin: Warm and dry. Musculoskeletal: No kyphosis. Neuropsychiatric: Alert and oriented x3, affect grossly appropriate.  Recent Labwork: No results found for requested labs within last 365 days.  No results found for: CHOL, TRIG, HDL, CHOLHDL, VLDL,  LDLCALC, LDLDIRECT  Other Studies Reviewed Today:   Assessment and Plan:  Chronic systolic heart failure LVEF 45-50%: New diagnosis in December 2024 when she was having DOE and swelling.  Symptoms improved now but she still has DOE with daily chores and 1-2+ pitting edema in bilateral legs. Start Farxiga  10 mg once daily and obtain BMP in 5 days.  Continue p.o. Lasix 20 mg once daily, carvedilol 12.5 mg twice daily, olmesartan 40 mg once daily, hydralazine 25 mg twice daily. Niece reported that she was started on a medication that starts with s, probably spironolactone, she will upload the updated medication list later.  Serum creatinine is 1.3, stable.  She will need ischemia evaluation with cardiac stress PET.  Since she is already on GDMT, will update echocardiogram and evaluate for any improvement in LVEF.  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       Medication Adjustments/Labs and Tests Ordered: Current medicines are reviewed at length with the patient today.  Concerns regarding medicines are outlined above.    Disposition:  Follow up 1 month  Signed Kharis Lapenna Priya Benyamin Jeff, MD, 04/30/2024 3:24 PM    [facility] at [facility] [address] [place], [place], [place] [address]

## 2024-04-30 NOTE — Patient Instructions (Addendum)
 Medication Instructions:  Your physician has recommended you make the following change in your medication:  Start taking Farxiga 10 mg once daily Continue taking all other medications as prescribed   Labwork: BMET to be completed in 1 week at [facility]  Testing/Procedures: Your physician has requested that you have an echocardiogram. Echocardiography is a painless test that uses sound waves to create images of your heart. It provides your doctor with information about the size and shape of your heart and how well your heart's chambers and valves are working. This procedure takes approximately one hour. There are no restrictions for this procedure. Please do NOT wear cologne, perfume, aftershave, or lotions (deodorant is allowed). Please arrive 15 minutes prior to your appointment time.  Please note: We ask at that you not bring children with you during ultrasound (echo/ vascular) testing. Due to room size and safety concerns, children are not allowed in the ultrasound rooms during exams. Our front office staff cannot provide observation of children in our lobby area while testing is being conducted. An adult accompanying a patient to their appointment will only be allowed in the ultrasound room at the discretion of the ultrasound technician under special circumstances. We apologize for any inconvenience.     Please report to Radiology at the [facility] Main Entrance 30 minutes early for your test.  [address] [place], [place] [address]                         OR   Please report to Radiology at [facility] Main Entrance, medical mall, 30 mins prior to your test.  [address]  [place], [place]  How to Prepare for Your Cardiac PET/CT Stress Test:  Nothing to eat or drink, except water , 3 hours prior to arrival time.  NO caffeine/decaffeinated products, or chocolate 12 hours prior to arrival. (Please note decaffeinated beverages  (teas/coffees) still contain caffeine).  If you have caffeine within 12 hours prior, the test will need to be rescheduled.  Medication instructions: Do not take erectile dysfunction medications for 72 hours prior to test (sildenafil, tadalafil) Do not take nitrates (isosorbide mononitrate, Ranexa) the day before or day of test Do not take tamsulosin the day before or morning of test Hold theophylline containing medications for 12 hours. Hold Dipyridamole 48 hours prior to the test.  Diabetic Preparation: If able to eat breakfast prior to 3 hour fasting, you may take all medications, including your insulin. Do not worry if you miss your breakfast dose of insulin - start at your next meal. If you do not eat prior to 3 hour fast-Hold all diabetes (oral and insulin) medications. Patients who wear a continuous glucose monitor MUST remove the device prior to scanning.  You may take your remaining medications with water .  NO perfume, cologne or lotion on chest or abdomen area. FEMALES - Please avoid wearing dresses to this appointment.  Total time is 1 to 2 hours; you may want to bring reading material for the waiting time.  IF YOU THINK YOU MAY BE PREGNANT, OR ARE NURSING PLEASE INFORM THE TECHNOLOGIST.  In preparation for your appointment, medication and supplies will be purchased.  Appointment availability is limited, so if you need to cancel or reschedule, please call the Radiology Department Scheduler at [phone] 24 hours in advance to avoid a cancellation fee of $100.00  What to Expect When you Arrive:  Once you arrive and check  in for your appointment, you will be taken to a preparation room within the Radiology Department.  A technologist or Nurse will obtain your medical history, verify that you are correctly prepped for the exam, and explain the procedure.  Afterwards, an IV will be started in your arm and electrodes will be placed on your skin for EKG monitoring during the stress  portion of the exam. Then you will be escorted to the PET/CT scanner.  There, staff will get you positioned on the scanner and obtain a blood pressure and EKG.  During the exam, you will continue to be connected to the EKG and blood pressure machines.  A small, safe amount of a radioactive tracer will be injected in your IV to obtain a series of pictures of your heart along with an injection of a stress agent.    After your Exam:  It is recommended that you eat a meal and drink a caffeinated beverage to counter act any effects of the stress agent.  Drink plenty of fluids for the remainder of the day and urinate frequently for the first couple of hours after the exam.  Your doctor will inform you of your test results within 7-10 business days.  For more information and frequently asked questions, please visit our website: [email or web address]  For questions about your test or how to prepare for your test, please call: Cardiac Imaging Nurse Navigators Office: [phone]   Follow-Up: Your physician recommends that you schedule a follow-up appointment in: 1 month follow up  Any Other Special Instructions Will Be Listed Below (If Applicable). Thank you for choosing [facility]!     If you need a refill on your cardiac medications before your next appointment, please call your pharmacy.

## 2024-05-06 ENCOUNTER — Encounter: Payer: Self-pay | Admitting: Internal Medicine

## 2024-05-19 ENCOUNTER — Ambulatory Visit: Payer: Self-pay | Admitting: Internal Medicine

## 2024-05-20 ENCOUNTER — Other Ambulatory Visit

## 2024-05-20 ENCOUNTER — Other Ambulatory Visit (HOSPITAL_COMMUNITY)

## 2024-05-25 ENCOUNTER — Telehealth: Payer: Self-pay

## 2024-05-25 NOTE — Telephone Encounter (Signed)
 Routed to Dr. Mallipeddi for further review

## 2024-05-25 NOTE — Telephone Encounter (Signed)
 Spoke with Niece regarding appointment 06/12. Advised her that a EKG was done not an Echo. There was one ordered as well as PET Stress Test. She stated that Dr. Orpha states that the PET Stress test would be too harsh on her kidneys. But patient has actually cancelled her test stated that she was feeling better. Scheduling had called to reschedule them and she declined. Niece stated that there was an echo performed at PCP office advised her will request that. Advised she has a follow up in August with Dr. Mallipeddi, but will forward the information over to her.

## 2024-05-29 ENCOUNTER — Encounter: Payer: Self-pay | Admitting: Internal Medicine

## 2024-06-01 ENCOUNTER — Ambulatory Visit: Admitting: Internal Medicine

## 2024-06-24 ENCOUNTER — Encounter: Payer: Self-pay | Admitting: Internal Medicine

## 2024-06-24 ENCOUNTER — Ambulatory Visit: Attending: Internal Medicine | Admitting: Internal Medicine

## 2024-06-24 VITALS — BP 152/78 | HR 60 | Ht <= 58 in | Wt 129.0 lb

## 2024-06-24 DIAGNOSIS — I5022 Chronic systolic (congestive) heart failure: Secondary | ICD-10-CM | POA: Diagnosis not present

## 2024-06-24 NOTE — Patient Instructions (Addendum)

## 2024-06-24 NOTE — Progress Notes (Signed)
 Cardiology Office Note  Date: 06/24/2024   ID: Cheyenne King, DOB 09-May-1946, MRN [id number]  PCP:  Orpha Yancey LABOR, MD  Cardiologist:  Diannah SHAUNNA Maywood, MD Electrophysiologist:  None   History of Present Illness: Cheyenne King is a 78 y.o. female  Referred to cardiology clinic for the management of chronic systolic heart failure.  Accompanied by twin sister who lives 1 block away.  Collateral history is obtained from the twin sister.  Echocardiogram in December 2024 showed LVEF 45 to 50%, moderate MR and mild AR.  Her PCP ordered echocardiogram in December 2024 due to DOE and that noted reduced LVEF. She is currently on GDMT and has noticed her DOE significantly improved.  Her DOE and leg swelling completely resolved after starting Farxiga  in the last clinic visit.  Plan was to obtain cardiac stress PET and echocardiogram. When she was called to schedule these test, she canceled stating that she is completely feeling better now and that she does not need any more testing.  She is here today for follow-up visit.  DOE and leg swelling completely resolved.  No angina, dizziness, syncope.  Past Medical History:  Diagnosis Date   Anemia    Arthritis    Essential hypertension    Fibromyalgia    GERD (gastroesophageal reflux disease)    Hypothyroidism    Osteoporosis    Pneumonia    as an infant and [id number]    Past Surgical History:  Procedure Laterality Date   ABDOMINAL HYSTERECTOMY  1975   POLYPS IN UTERUS-REMOVAL   BACK SURGERY     L3,4,5   EYE SURGERY     REMOVAL OF METAL OBJECT IN LEFT EYE   FRACTURE SURGERY     RIGHT FOOT   HIP ARTHROPLASTY Left 10/09/2015   Procedure: LEFT INTERTROCHANTERIC  ORIF  ;  Surgeon: Oneil JAYSON Herald, MD;  Location: [facility] OR;  Service: Orthopedics;  Laterality: Left;   TONSILLECTOMY      Current Outpatient Medications  Medication Sig Dispense Refill   alendronate (FOSAMAX) 70 MG tablet Take 70 mg by mouth once a week.     aspirin  EC 325 MG EC  tablet Take 1 tablet (325 mg total) by mouth daily with breakfast. 30 tablet 0   atorvastatin (LIPITOR) 20 MG tablet Take 20 mg by mouth at bedtime.     Calcium Carb-Cholecalciferol (CALCIUM CARBONATE-VITAMIN D3 PO) Take by mouth daily.     carvedilol (COREG) 12.5 MG tablet Take 12.5 mg by mouth 2 (two) times daily with a meal.     Cholecalciferol (VITAMIN D3) 50 MCG (2000 UT) capsule Take 2,000 Units by mouth daily.     colestipol (COLESTID) 1 g tablet Take 1 g by mouth 3 (three) times daily.     dapagliflozin  propanediol (FARXIGA ) 10 MG TABS tablet Take 1 tablet (10 mg total) by mouth daily before breakfast. 30 tablet 5   diazepam (VALIUM) 2 MG tablet Take 2 mg by mouth at bedtime as needed for anxiety.     diclofenac Sodium (VOLTAREN ARTHRITIS PAIN) 1 % GEL Apply topically 4 (four) times daily.     famotidine  (PEPCID ) 20 MG tablet Take 20 mg by mouth daily.     furosemide (LASIX) 20 MG tablet Take 20 mg by mouth daily.     hydrALAZINE (APRESOLINE) 25 MG tablet Take 25 mg by mouth 2 (two) times daily.     HYDROcodone -acetaminophen  (NORCO) 7.5-325 MG tablet Take 1 tablet by mouth 2 (two) times daily as  needed for moderate pain (pain score 4-6).     levothyroxine  (SYNTHROID , LEVOTHROID) 50 MCG tablet Take 50 mcg by mouth daily before breakfast.     Magnesium 200 MG TABS Take by mouth daily.     methocarbamol (ROBAXIN) 500 MG tablet Take 500 mg by mouth at bedtime.     mirtazapine (REMERON) 15 MG tablet Take 15 mg by mouth at bedtime.     olmesartan (BENICAR) 40 MG tablet Take 40 mg by mouth daily.     ranitidine (ZANTAC) 150 MG tablet Take 150 mg by mouth daily after supper.     sodium bicarbonate 650 MG tablet Take 650 mg by mouth 2 (two) times daily.     No current facility-administered medications for this visit.   Allergies:  Gabapentin, Neosporin original [bacitracin-neomycin-polymyxin], and Penicillins   Social History: The patient  reports that she quit smoking about 21 years ago. Her  smoking use included cigarettes. She started smoking about 71 years ago. She has a 75 pack-year smoking history. She does not have any smokeless tobacco history on file. She reports that she does not drink alcohol and does not use drugs.   Family History: The patient's family history includes Arthritis/Rheumatoid in her sister; CAD in her mother; Diabetes Mellitus II in her father and mother; Heart attack in her brother and father; Stroke in her mother.   ROS:  Please see the history of present illness. Otherwise, complete review of systems is positive for none  All other systems are reviewed and negative.   Physical Exam: VS:  BP (!) 152/78 (BP Location: Left Arm)   Pulse 60   Ht 4' 10 (1.473 m)   Wt 129 lb (58.5 kg)   SpO2 98%   BMI 26.96 kg/m , BMI Body mass index is 26.96 kg/m.  Wt Readings from Last 3 Encounters:  06/24/24 129 lb (58.5 kg)  04/30/24 121 lb 12.8 oz (55.2 kg)  10/10/15 133 lb 8 oz (60.6 kg)    General: Patient appears comfortable at rest. HEENT: Conjunctiva and lids normal, oropharynx clear with moist mucosa. Neck: Supple, no elevated JVP or carotid bruits, no thyromegaly. Lungs: Clear to auscultation, nonlabored breathing at rest. Cardiac: Regular rate and rhythm, no murmur. Abdomen: Soft, nontender, no hepatomegaly, bowel sounds present, no guarding or rebound. Extremities: no pitting edema Skin: Warm and dry. Musculoskeletal: No kyphosis. Neuropsychiatric: Alert and oriented x3, affect grossly appropriate.  Recent Labwork: No results found for requested labs within last 365 days.  No results found for: CHOL, TRIG, HDL, CHOLHDL, VLDL, LDLCALC, LDLDIRECT   Assessment and Plan:  Chronic systolic heart failure LVEF 45-50%: New diagnosis in December 2024. She was having DOE and pitting edema when I saw her in June 2025 and her symptoms of DOE/leg swelling resolved completely after starting Farxiga .  Continue current GDMT, p.o. furosemide 20 mg  once daily, carvedilol 12.5 mg twice daily, olmesartan 40 mg once daily, hydralazine 25 mg twice daily and Farxiga  10 mg once daily.  Serum creatinine 1.3.  Ideally she will need ischemia valuation with cardiac stress PET but patient's not comfortable driving to [place] as she gets lost in the traffic.  Will update echocardiogram.  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  I had an extensive discussion with the patient and her sister today.  Agreeable to get echocardiogram.  Obtain echo.       Medication Adjustments/Labs and Tests Ordered: Current medicines are reviewed at length with the patient today.  Concerns regarding medicines are outlined above.    Disposition:  Follow up 6 months  Signed Adin Lariccia Priya Rob Mciver, MD, 06/24/2024 12:51 PM    [facility] [address] [place], [place], [place] [address]

## 2024-06-26 ENCOUNTER — Ambulatory Visit: Admitting: Internal Medicine

## 2024-07-01 DIAGNOSIS — I5032 Chronic diastolic (congestive) heart failure: Secondary | ICD-10-CM | POA: Diagnosis not present

## 2024-07-01 DIAGNOSIS — E038 Other specified hypothyroidism: Secondary | ICD-10-CM | POA: Diagnosis not present

## 2024-07-01 DIAGNOSIS — I10 Essential (primary) hypertension: Secondary | ICD-10-CM | POA: Diagnosis not present

## 2024-07-01 DIAGNOSIS — D649 Anemia, unspecified: Secondary | ICD-10-CM | POA: Diagnosis not present

## 2024-07-01 DIAGNOSIS — N1832 Chronic kidney disease, stage 3b: Secondary | ICD-10-CM | POA: Diagnosis not present

## 2024-07-01 DIAGNOSIS — R5383 Other fatigue: Secondary | ICD-10-CM | POA: Diagnosis not present

## 2024-07-01 DIAGNOSIS — Z6826 Body mass index (BMI) 26.0-26.9, adult: Secondary | ICD-10-CM | POA: Diagnosis not present

## 2024-07-01 DIAGNOSIS — M818 Other osteoporosis without current pathological fracture: Secondary | ICD-10-CM | POA: Diagnosis not present

## 2024-07-16 ENCOUNTER — Other Ambulatory Visit

## 2024-07-21 ENCOUNTER — Telehealth: Payer: Self-pay | Admitting: Internal Medicine

## 2024-07-21 NOTE — Telephone Encounter (Signed)
 Per Patient:  (dont want to the appt anymoe. states that she doing well)

## 2024-07-21 NOTE — Telephone Encounter (Signed)
 Sent to scheduling to reschedule Echo that was cancelled. Per patient didn't want to reschedule and that she was doing fine. Patient is on a recall for 6 months

## 2024-09-29 DIAGNOSIS — I5031 Acute diastolic (congestive) heart failure: Secondary | ICD-10-CM | POA: Diagnosis not present

## 2024-09-29 DIAGNOSIS — I5032 Chronic diastolic (congestive) heart failure: Secondary | ICD-10-CM | POA: Diagnosis not present

## 2024-09-29 DIAGNOSIS — Z6825 Body mass index (BMI) 25.0-25.9, adult: Secondary | ICD-10-CM | POA: Diagnosis not present

## 2024-09-30 DIAGNOSIS — I5032 Chronic diastolic (congestive) heart failure: Secondary | ICD-10-CM | POA: Diagnosis not present

## 2024-09-30 DIAGNOSIS — N1832 Chronic kidney disease, stage 3b: Secondary | ICD-10-CM | POA: Diagnosis not present
